# Patient Record
Sex: Female | Born: 1980 | Race: Asian | Hispanic: No | Marital: Married | Smoking: Never smoker
Health system: Southern US, Community
[De-identification: ages and names within clinical notes are randomized; demographics above are authoritative.]

## PROBLEM LIST (undated history)

## (undated) DIAGNOSIS — E039 Hypothyroidism, unspecified: Secondary | ICD-10-CM

## (undated) DIAGNOSIS — E119 Type 2 diabetes mellitus without complications: Secondary | ICD-10-CM

## (undated) DIAGNOSIS — O24419 Gestational diabetes mellitus in pregnancy, unspecified control: Secondary | ICD-10-CM

## (undated) HISTORY — DX: Type 2 diabetes mellitus without complications: E11.9

## (undated) HISTORY — PX: NO PAST SURGERIES: SHX2092

---

## 2016-08-24 ENCOUNTER — Other Ambulatory Visit: Payer: Self-pay | Admitting: Family Medicine

## 2016-08-24 DIAGNOSIS — N63 Unspecified lump in unspecified breast: Secondary | ICD-10-CM

## 2016-10-01 ENCOUNTER — Other Ambulatory Visit (HOSPITAL_COMMUNITY): Payer: Self-pay | Admitting: Obstetrics & Gynecology

## 2016-10-01 DIAGNOSIS — N979 Female infertility, unspecified: Secondary | ICD-10-CM

## 2016-10-04 ENCOUNTER — Encounter (HOSPITAL_COMMUNITY): Payer: Self-pay | Admitting: Radiology

## 2016-10-04 ENCOUNTER — Ambulatory Visit (HOSPITAL_COMMUNITY)
Admission: RE | Admit: 2016-10-04 | Discharge: 2016-10-04 | Disposition: A | Discharge status: Home / Self Care | Payer: BLUE CROSS/BLUE SHIELD | Source: Ambulatory Visit | Attending: Obstetrics & Gynecology | Admitting: Obstetrics & Gynecology

## 2016-10-04 DIAGNOSIS — N979 Female infertility, unspecified: Secondary | ICD-10-CM | POA: Diagnosis not present

## 2016-10-04 MED ORDER — IOPAMIDOL (ISOVUE-300) INJECTION 61%
30.0000 mL | Freq: Once | INTRAVENOUS | Status: AC | PRN
  Administered 2016-10-04: 16 mL

## 2016-10-05 ENCOUNTER — Other Ambulatory Visit: Payer: Self-pay | Admitting: Family Medicine

## 2016-10-05 DIAGNOSIS — N63 Unspecified lump in unspecified breast: Secondary | ICD-10-CM

## 2016-11-06 ENCOUNTER — Ambulatory Visit
Admission: RE | Admit: 2016-11-06 | Discharge: 2016-11-06 | Disposition: A | Discharge status: Home / Self Care | Payer: BLUE CROSS/BLUE SHIELD | Source: Ambulatory Visit | Attending: Family Medicine | Admitting: Family Medicine

## 2016-11-06 ENCOUNTER — Other Ambulatory Visit: Payer: Self-pay | Admitting: Family Medicine

## 2016-11-06 DIAGNOSIS — N6310 Unspecified lump in the right breast, unspecified quadrant: Secondary | ICD-10-CM

## 2016-11-06 DIAGNOSIS — N63 Unspecified lump in unspecified breast: Secondary | ICD-10-CM

## 2018-02-10 IMAGING — MG 2D DIGITAL DIAGNOSTIC BILATERAL MAMMOGRAM WITH CAD AND ADJUNCT T
9 of 15 series · 9 of 35 positions shown · non-contrast
Comparison: None

CLINICAL DATA: Palpable lump in the left breast.

EXAM:
2D DIGITAL DIAGNOSTIC BILATERAL MAMMOGRAM WITH CAD AND ADJUNCT TOMO
ULTRASOUND BILATERAL BREAST

[L CC synth-2D]
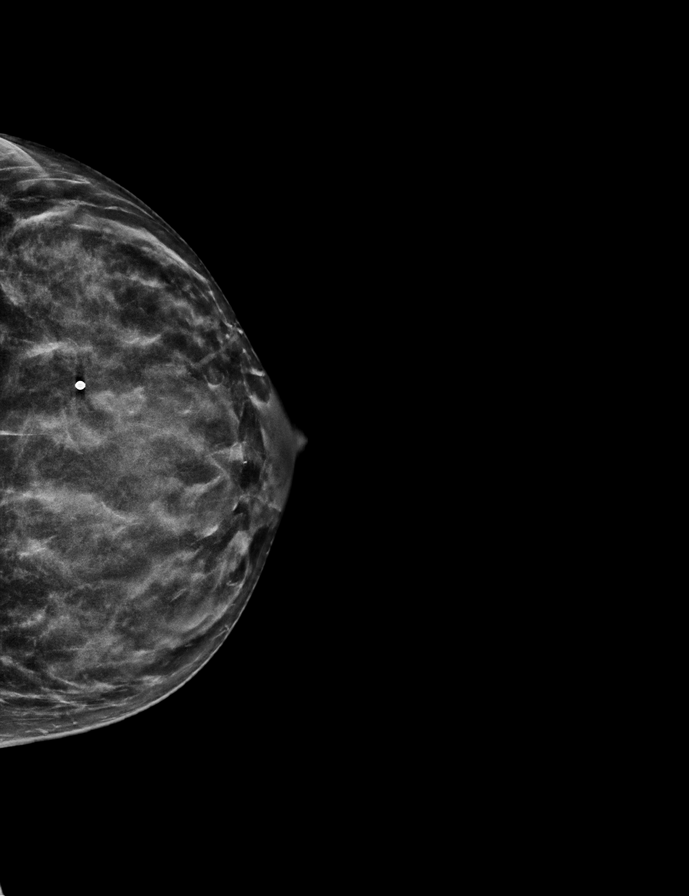

[R CC]
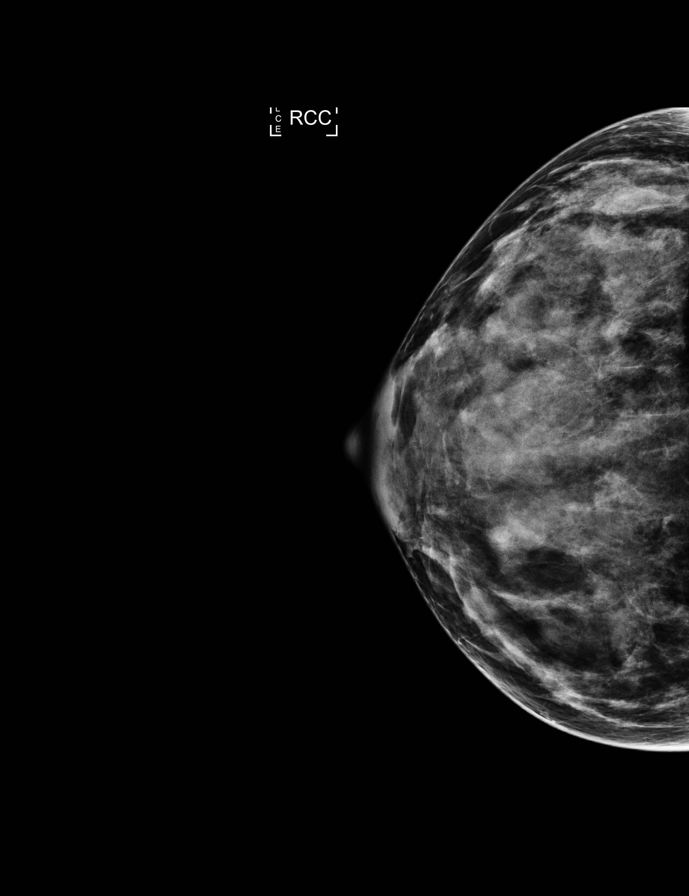

[L MLO]
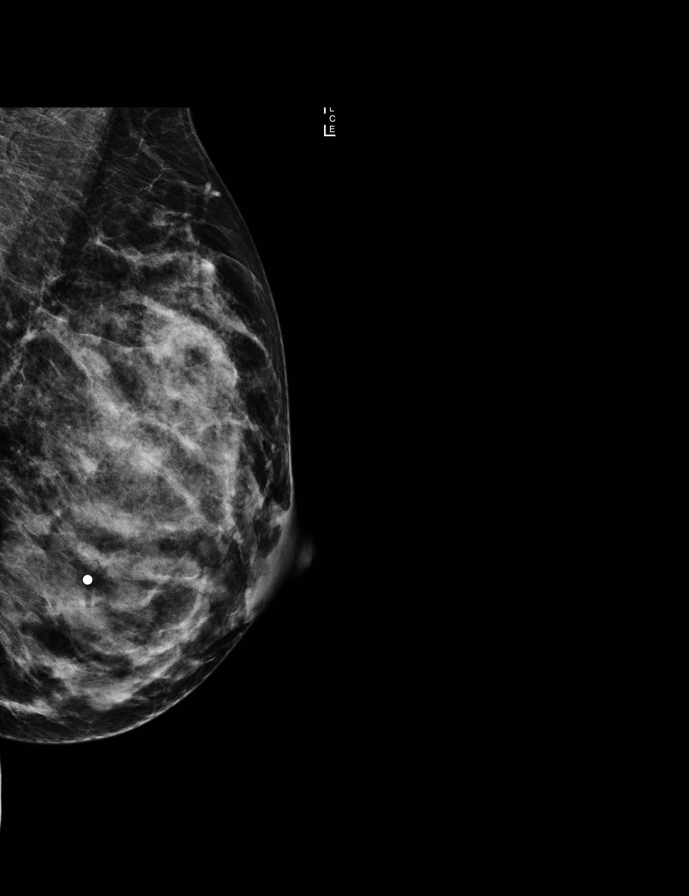

[L TAN]
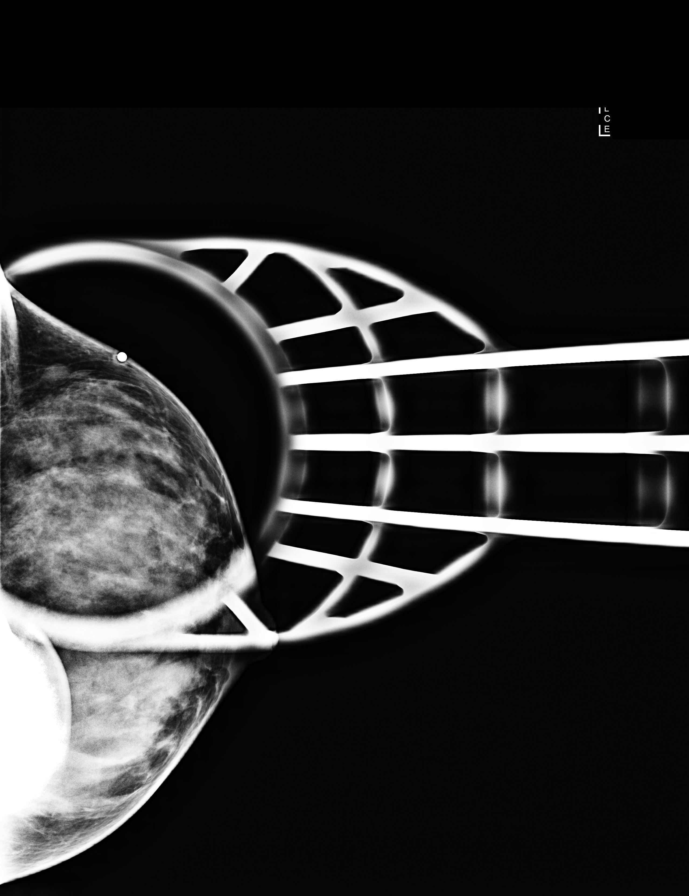

[L CC]
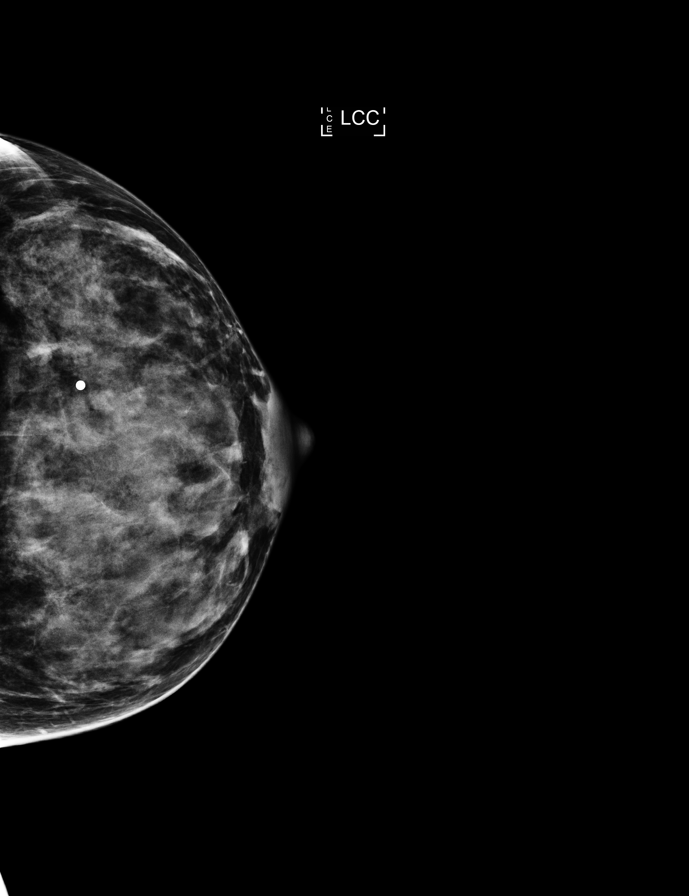

[R MLO]
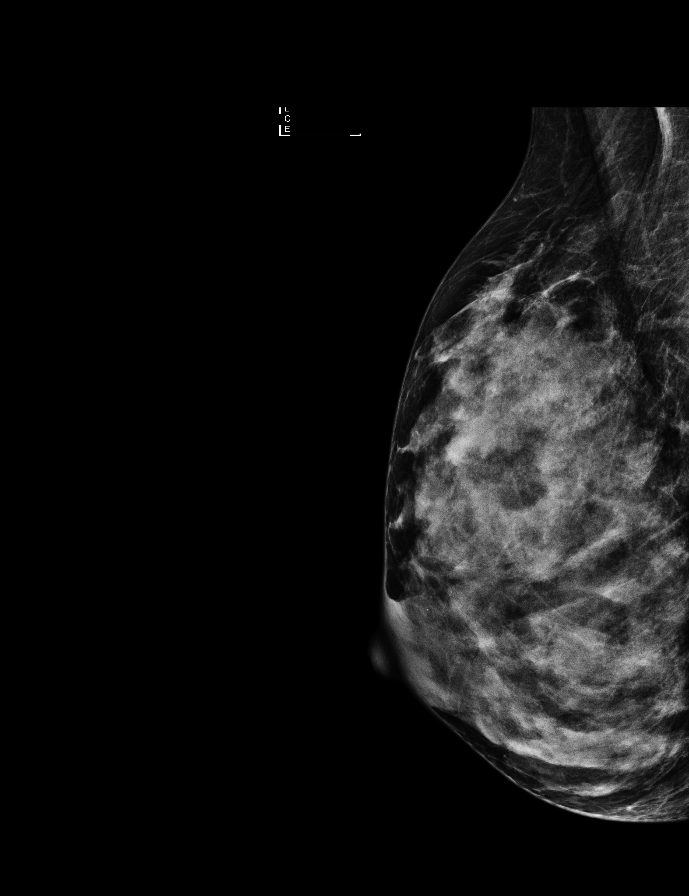

[L MLO synth-2D]
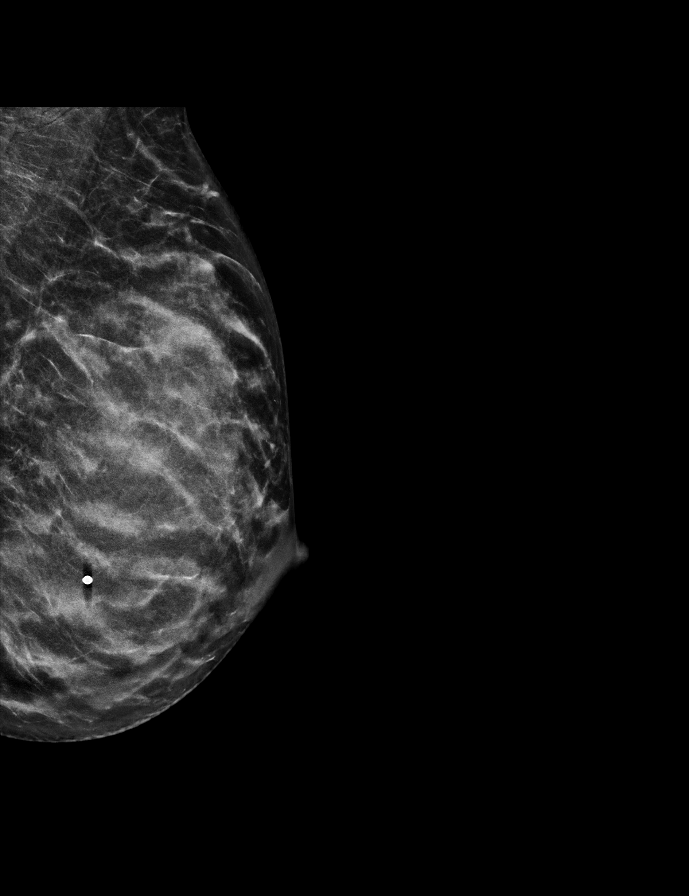

[R MLO synth-2D]
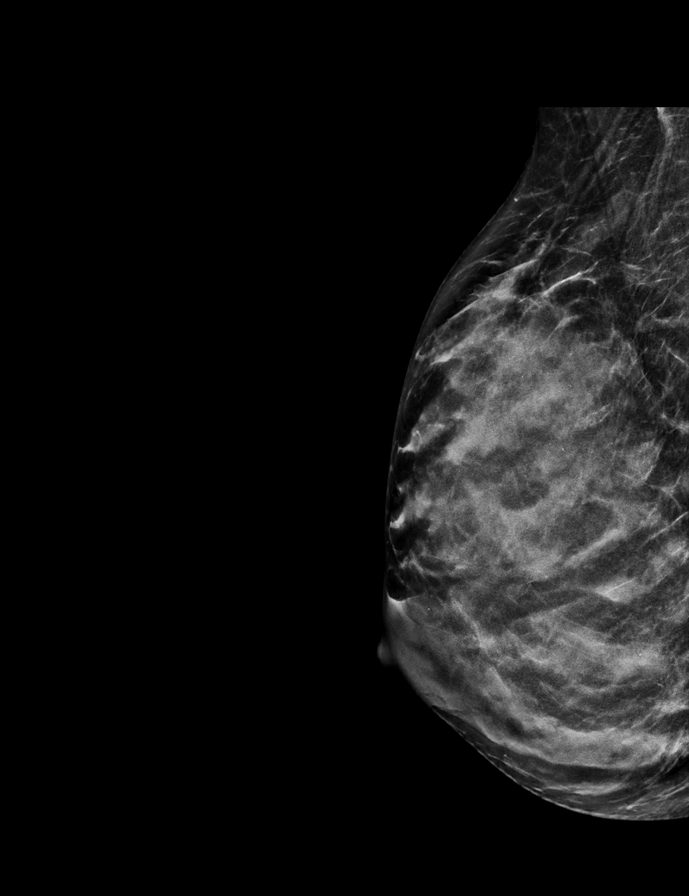

[R CC synth-2D]
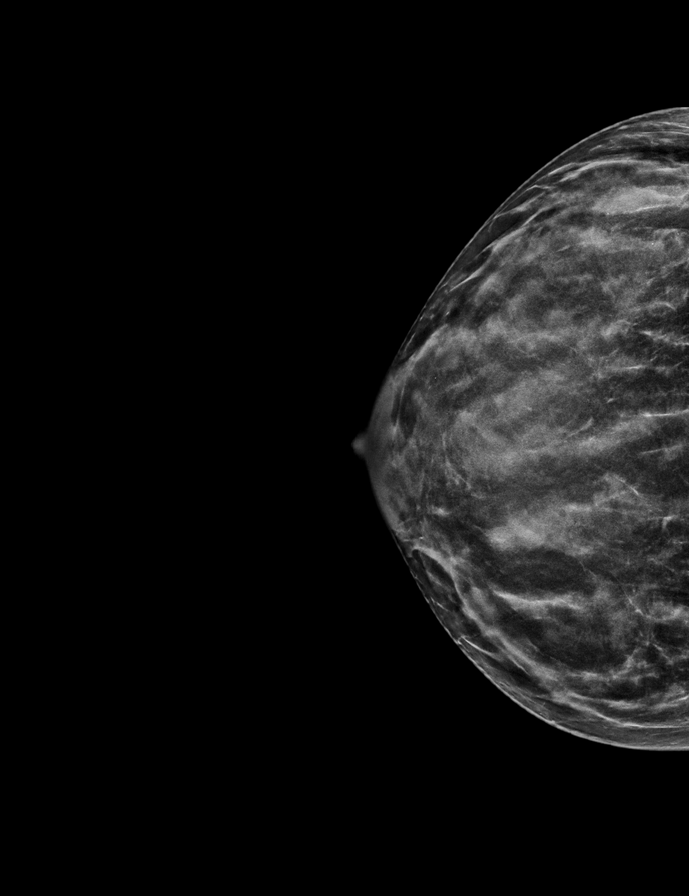

[9 of 35 positions shown; findings below may reference images not displayed]

ACR Breast Density Category c: The breast tissue is heterogeneously
dense, which may obscure small masses.
FINDINGS: There is an oval mass at 4 o'clock in the left breast. A rounded
asymmetry at 12 o'clock in the right breast is felt to most likely
represent glandular tissue.

Mammographic images were processed with CAD.

On physical exam, no suspicious lumps are identified.

Targeted ultrasound is performed, showing a simple cyst in the left
breast at 4 o'clock correlating with the mammographic findings. No
abnormalities seen on the right.
IMPRESSION: Fibrocystic changes on the left. No suspicious findings on the
right.

RECOMMENDATION:
Annual screening mammography beginning at the age of 40.

I have discussed the findings and recommendations with the patient.
Results were also provided in writing at the conclusion of the
visit. If applicable, a reminder letter will be sent to the patient
regarding the next appointment.

BI-RADS CATEGORY  2: Benign.

## 2018-02-21 LAB — OB RESULTS CONSOLE RUBELLA ANTIBODY, IGM: Rubella: IMMUNE

## 2018-02-21 LAB — OB RESULTS CONSOLE ABO/RH: RH Type: POSITIVE

## 2018-02-21 LAB — OB RESULTS CONSOLE ANTIBODY SCREEN: Antibody Screen: NEGATIVE

## 2018-02-21 LAB — OB RESULTS CONSOLE HEPATITIS B SURFACE ANTIGEN: Hepatitis B Surface Ag: NEGATIVE

## 2018-02-21 LAB — OB RESULTS CONSOLE RPR: RPR: NONREACTIVE

## 2018-02-21 LAB — OB RESULTS CONSOLE HIV ANTIBODY (ROUTINE TESTING): HIV: NONREACTIVE

## 2018-03-19 LAB — OB RESULTS CONSOLE GC/CHLAMYDIA
Chlamydia: NEGATIVE
Gonorrhea: NEGATIVE

## 2018-07-10 ENCOUNTER — Encounter: Payer: Self-pay | Admitting: Dietician

## 2018-07-10 ENCOUNTER — Ambulatory Visit: Payer: BLUE CROSS/BLUE SHIELD | Admitting: Dietician

## 2018-07-10 DIAGNOSIS — O24410 Gestational diabetes mellitus in pregnancy, diet controlled: Secondary | ICD-10-CM

## 2018-07-10 NOTE — Progress Notes (Signed)
Patient lives with her husband.  Her husband does the shopping and cooking.  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  This visit was completed via telephone due to the COVID-19 pandemic.   I spoke with Norma Newman and verified that I was speaking with the correct person with two patient identifiers (full name and date of birth).   I discussed the limitations related to this kind of visit and the patient is willing to proceed.  Her husband Norma Newman is present during the visit. Time of visit:  4:15-5:15.  Consult was for BG management, insulin instruction, and Carbohydrate Counting in patient with newly diagnosed GDM. Currently [redacted] weeks gestation 113 lbs and 92 lbs pre pregnancy.  Diet hx: Dislikes vegetables (will eat broccoli, green beans, mushrooms, bok choy, and salad) Breakfast:  Egg, sausage, 1 cup white rice Snack:  None Lunch:  White rice, chicken Snack chips or cookies Dinner:  White rice, chicken or pork Snack:  Milk Beverages:  1 small can regular soda, water, decaf coffee with 1 tsp sugar  Patient was seen on 07/10/18 for Gestational Diabetes self-management. The following learning objectives were met by the patient during this course:   States the definition of Gestational Diabetes  States why dietary management is important in controlling blood glucose  Describes the effects each nutrient has on blood glucose levels  Demonstrates ability to create a balanced meal plan  Demonstrates carbohydrate counting   States when to check blood glucose levels  Demonstrates proper blood glucose monitoring techniques  States the effect of stress and exercise on blood glucose levels  States the importance of limiting caffeine and abstaining from alcohol and smoking  She is to pick up her BG meter tomorrow and we will discuss its use after. Insulin instruction basics provided.  Her husband gave the IVF shots and discussed that she would need to learn to give them herself if  needed.  Patient instructed to monitor glucose levels: FBS: 60 - <90 1 hour: <140 2 hour: <120  Plan: Check your blood sugar 4 times daily  Before breakfast  2 hours after the start of Breakfast, Lunch, and dinner  Consider ways to be more active such as walking or a pregnancy exercise video on the computer or other as allowed by your MD  Consider having smaller amounts of rice with your meals, changing to brown rice and including small snacks throughout the day. Consider adding vegetables throughout the day.  Please call or email be with any questions.    *Patient received handouts:  Nutrition Diabetes and Pregnancy  Snack list  Patient will be seen for follow-up as needed.

## 2018-07-10 NOTE — Patient Instructions (Signed)
Check your blood sugar 4 times daily  Before breakfast  2 hours after the start of Breakfast, Lunch, and dinner  Consider ways to be more active such as walking or a pregnancy exercise video on the computer or other as allowed by your MD  Consider having smaller amounts of rice with your meals, changing to brown rice and including small snacks throughout the day. Consider adding vegetables throughout the day.  Please call or email be with any questions.    Antonieta Iba, RD, LDN, CDE [email or web address]

## 2018-07-14 ENCOUNTER — Telehealth: Payer: Self-pay | Admitting: Dietician

## 2018-07-14 NOTE — Telephone Encounter (Signed)
Called and left a message on Scharlene's phone 07/13/18 and 07/14/18 for a follow up regarding blood glucose monitoring.  Left my cell phone number on her answering machine today and asked her to text or leave a message regarding a good time to follow up regarding instruction on BG monitoring or to see if she has been doing this and how it is going.  Antonieta Iba, RD, LDN, CDE

## 2018-07-14 NOTE — Telephone Encounter (Signed)
Norma Newman responded via text.  They have the FreeStyle Lite BG meter and have been using this since Saturday. BG readings are as follows:  07/12/18:  111 2 hours after lunch, 79 2 hours after dinner  07/13/18:  74 before breakfast, 84 after breakfast, 115 after lunch, and 109 after dinner.  I responded that her BG numbers look good.  She should be sure to eat adequate amounts as to not be hungry and nourish herself and the baby.   Told her to contact me with any questions.  Antonieta Iba, RD, LDN, CDE

## 2018-09-01 ENCOUNTER — Encounter (HOSPITAL_COMMUNITY): Payer: Self-pay | Admitting: *Deleted

## 2018-09-01 ENCOUNTER — Inpatient Hospital Stay (HOSPITAL_COMMUNITY)
Admission: AD | Admit: 2018-09-01 | Discharge: 2018-09-05 | DRG: 788 | Disposition: A | Discharge status: Home / Self Care | Payer: BC Managed Care – PPO | Attending: Obstetrics & Gynecology | Admitting: Obstetrics & Gynecology

## 2018-09-01 ENCOUNTER — Other Ambulatory Visit: Payer: Self-pay

## 2018-09-01 DIAGNOSIS — E039 Hypothyroidism, unspecified: Secondary | ICD-10-CM | POA: Diagnosis present

## 2018-09-01 DIAGNOSIS — O139 Gestational [pregnancy-induced] hypertension without significant proteinuria, unspecified trimester: Secondary | ICD-10-CM | POA: Diagnosis present

## 2018-09-01 DIAGNOSIS — O1404 Mild to moderate pre-eclampsia, complicating childbirth: Secondary | ICD-10-CM | POA: Diagnosis present

## 2018-09-01 DIAGNOSIS — O3413 Maternal care for benign tumor of corpus uteri, third trimester: Secondary | ICD-10-CM | POA: Diagnosis present

## 2018-09-01 DIAGNOSIS — O133 Gestational [pregnancy-induced] hypertension without significant proteinuria, third trimester: Secondary | ICD-10-CM

## 2018-09-01 DIAGNOSIS — O99284 Endocrine, nutritional and metabolic diseases complicating childbirth: Secondary | ICD-10-CM | POA: Diagnosis present

## 2018-09-01 DIAGNOSIS — D259 Leiomyoma of uterus, unspecified: Secondary | ICD-10-CM | POA: Diagnosis present

## 2018-09-01 DIAGNOSIS — O24420 Gestational diabetes mellitus in childbirth, diet controlled: Secondary | ICD-10-CM | POA: Diagnosis present

## 2018-09-01 DIAGNOSIS — Z3A37 37 weeks gestation of pregnancy: Secondary | ICD-10-CM

## 2018-09-01 DIAGNOSIS — Z1159 Encounter for screening for other viral diseases: Secondary | ICD-10-CM | POA: Diagnosis not present

## 2018-09-01 HISTORY — DX: Hypothyroidism, unspecified: E03.9

## 2018-09-01 HISTORY — DX: Gestational diabetes mellitus in pregnancy, unspecified control: O24.419

## 2018-09-01 LAB — COMPREHENSIVE METABOLIC PANEL
ALT: 20 U/L (ref 0–44)
AST: 27 U/L (ref 15–41)
Albumin: 2.5 g/dL — ABNORMAL LOW (ref 3.5–5.0)
Alkaline Phosphatase: 113 U/L (ref 38–126)
Anion gap: 8 (ref 5–15)
BUN: 13 mg/dL (ref 6–20)
CO2: 19 mmol/L — ABNORMAL LOW (ref 22–32)
Calcium: 8.8 mg/dL — ABNORMAL LOW (ref 8.9–10.3)
Chloride: 109 mmol/L (ref 98–111)
Creatinine, Ser: 0.83 mg/dL (ref 0.44–1.00)
GFR calc Af Amer: >60 mL/min (ref >60)
GFR calc non Af Amer: >60 mL/min (ref >60)
Glucose, Bld: 84 mg/dL (ref 70–99)
Potassium: 4.5 mmol/L (ref 3.5–5.1)
Sodium: 136 mmol/L (ref 135–145)
Total Bilirubin: 0.7 mg/dL (ref 0.3–1.2)
Total Protein: 5.2 g/dL — ABNORMAL LOW (ref 6.5–8.1)

## 2018-09-01 LAB — TYPE AND SCREEN
ABO/RH(D): B POS
Antibody Screen: NEGATIVE

## 2018-09-01 LAB — PROTEIN / CREATININE RATIO, URINE
Creatinine, Urine: 65.18 mg/dL
Protein Creatinine Ratio: 6.83 mg/mg{Cre} — ABNORMAL HIGH (ref 0.00–0.15)
Total Protein, Urine: 445 mg/dL

## 2018-09-01 LAB — OB RESULTS CONSOLE GBS: GBS: NEGATIVE

## 2018-09-01 LAB — CBC
HCT: 37.7 % (ref 36.0–46.0)
Hemoglobin: 13.3 g/dL (ref 12.0–15.0)
MCH: 32.3 pg (ref 26.0–34.0)
MCHC: 35.3 g/dL (ref 30.0–36.0)
MCV: 91.5 fL (ref 80.0–100.0)
Platelets: 215 10*3/uL (ref 150–400)
RBC: 4.12 MIL/uL (ref 3.87–5.11)
RDW: 12.9 % (ref 11.5–15.5)
WBC: 8.7 10*3/uL (ref 4.0–10.5)
nRBC: 0 % (ref 0.0–0.2)

## 2018-09-01 LAB — SARS CORONAVIRUS 2 BY RT PCR (HOSPITAL ORDER, PERFORMED IN ~~LOC~~ HOSPITAL LAB): SARS Coronavirus 2: NEGATIVE

## 2018-09-01 LAB — ABO/RH: ABO/RH(D): B POS

## 2018-09-01 LAB — GLUCOSE, CAPILLARY: Glucose-Capillary: 80 mg/dL (ref 70–99)

## 2018-09-01 MED ORDER — HYDRALAZINE HCL 20 MG/ML IJ SOLN: 10.0000 mg | INTRAMUSCULAR | Status: DC | PRN

## 2018-09-01 MED ORDER — LABETALOL HCL 5 MG/ML IV SOLN: 20.0000 mg | INTRAVENOUS | Status: DC | PRN

## 2018-09-01 MED ORDER — LABETALOL HCL 5 MG/ML IV SOLN: 80.0000 mg | INTRAVENOUS | Status: DC | PRN

## 2018-09-01 MED ORDER — LIDOCAINE HCL (PF) 1 % IJ SOLN: 30.0000 mL | INTRAMUSCULAR | Status: DC | PRN

## 2018-09-01 MED ORDER — SOD CITRATE-CITRIC ACID 500-334 MG/5ML PO SOLN
30.0000 mL | ORAL | Status: DC | PRN
  Filled 2018-09-01: qty 30

## 2018-09-01 MED ORDER — LACTATED RINGERS IV SOLN
INTRAVENOUS | Status: DC
  Administered 2018-09-01 – 2018-09-02 (×6): via INTRAVENOUS

## 2018-09-01 MED ORDER — LEVOTHYROXINE SODIUM 25 MCG PO TABS
75.0000 ug | ORAL_TABLET | Freq: Every day | ORAL | Status: DC
  Administered 2018-09-02 – 2018-09-05 (×4): 75 ug via ORAL
  Filled 2018-09-01: qty 1
  Filled 2018-09-01 (×2): qty 3
  Filled 2018-09-01: qty 1
  Filled 2018-09-01: qty 3

## 2018-09-01 MED ORDER — LABETALOL HCL 5 MG/ML IV SOLN: 40.0000 mg | INTRAVENOUS | Status: DC | PRN

## 2018-09-01 MED ORDER — LACTATED RINGERS IV SOLN
500.0000 mL | INTRAVENOUS | Status: DC | PRN
  Administered 2018-09-02: 1000 mL via INTRAVENOUS

## 2018-09-01 MED ORDER — MISOPROSTOL 25 MCG QUARTER TABLET
25.0000 ug | ORAL_TABLET | ORAL | Status: DC | PRN
  Administered 2018-09-01 (×2): 25 ug via VAGINAL
  Filled 2018-09-01 (×2): qty 1

## 2018-09-01 MED ORDER — TERBUTALINE SULFATE 1 MG/ML IJ SOLN: 0.2500 mg | Freq: Once | INTRAMUSCULAR | Status: DC | PRN

## 2018-09-01 MED ORDER — OXYTOCIN BOLUS FROM INFUSION: 500.0000 mL | Freq: Once | INTRAVENOUS | Status: DC

## 2018-09-01 MED ORDER — OXYTOCIN 40 UNITS IN NORMAL SALINE INFUSION - SIMPLE MED: 2.5000 [IU]/h | INTRAVENOUS | Status: DC

## 2018-09-01 MED ORDER — ACETAMINOPHEN 325 MG PO TABS: 650.0000 mg | ORAL_TABLET | ORAL | Status: DC | PRN

## 2018-09-01 MED ORDER — OXYCODONE-ACETAMINOPHEN 5-325 MG PO TABS: 1.0000 | ORAL_TABLET | ORAL | Status: DC | PRN

## 2018-09-01 MED ORDER — BUTORPHANOL TARTRATE 1 MG/ML IJ SOLN
1.0000 mg | INTRAMUSCULAR | Status: DC | PRN
  Administered 2018-09-02 (×2): 1 mg via INTRAVENOUS
  Filled 2018-09-01 (×2): qty 1

## 2018-09-01 MED ORDER — OXYCODONE-ACETAMINOPHEN 5-325 MG PO TABS: 2.0000 | ORAL_TABLET | ORAL | Status: DC | PRN

## 2018-09-01 NOTE — Plan of Care (Signed)
Patient educated about the S/S of Pre eclampsia during labor as well as postpartum.  Patient verbalized understanding and encouraged to ask questions throughout admission.

## 2018-09-01 NOTE — H&P (Signed)
HPI: 38 y/o G1P0 @ [redacted]w[redacted]d estimated gestational age (as dated by LMP c/w first trimester ultrasound) presents for IOL due to preeclampsia- no severe features.   no Leaking of Fluid,   no Vaginal Bleeding,   no Uterine Contractions,  + Fetal Movement.  Prenatal care has been provided by Dr. Nelda Marseille  ROS: no HA, no epigastric pain, no visual changes.    Pregnancy complicated by: 1) Preeclampsia BP elevated in office, lab work completed today PC ratio 6.8  2) GDMA1- well controlled with diet Last US@ 34w- vertex, 5#2oz (49%), stable fibroid- post 5.3cm  3) Hypothyroidism- duration of pregnancy 39mcg daily, recently increased to 48mcg daily 4)  AMA- normal genetic screening and [place] 5) IVF pregnancy   Prenatal Transfer Tool  Maternal Diabetes: Yes:  Diabetes Type:  Diet controlled Genetic Screening: Normal Maternal Ultrasounds/Referrals: Normal Fetal Ultrasounds or other Referrals:  None Maternal Substance Abuse:  No Significant Maternal Medications:  Meds include: Syntroid Significant Maternal Lab Results: Group B Strep negative   PNL:  GBS negative, Rub Immune, Hep B neg, RPR NR, HIV neg, GC/C neg, glucola:abnormal Hgb: 12.8 Blood type: B positive, antibody ne  Immunizations: Tdap: 5/22 Flu: 1/8  OBHx: primip PMHx:  hypothyroidism Meds:  PNV, synthroid,  Allergy:   Allergies  Allergen Reactions  . Bactrim [Sulfamethoxazole-Trimethoprim] Hives    Stomach pain, hives, itching   SurgHx: none SocHx:   no Tobacco, no  EtOH, no Illicit Drugs  O: BP (!) 137/94   Pulse 78   Temp 98.6 F (37 C) (Oral)   Resp 20   Ht 4\' 10"  (1.473 m)   Wt 58.1 kg   SpO2 99%   BMI 26.75 kg/m  Gen. AAOx3, NAD CV.  RRR  No murmur.  Resp. CTAB, no wheeze or crackles. Abd. Gravid,  no tenderness,  no rigidity,  no guarding Extr.  2+ edema B/L , no calf tenderness, neg Homan's B/L, DTRs 2+, no clonus  FHT: 140 baseline, moderate variability, + accels,  no decels Toco: irregular  SVE:  deferred   Labs:  Results for orders placed or performed during the hospital encounter of 09/01/18 (from the past 24 hour(s))  CBC     Status: None   Collection Time: 09/01/18  1:42 PM  Result Value Ref Range   WBC 8.7 4.0 - 10.5 K/uL   RBC 4.12 3.87 - 5.11 MIL/uL   Hemoglobin 13.3 12.0 - 15.0 g/dL   HCT 37.7 36.0 - 46.0 %   MCV 91.5 80.0 - 100.0 fL   MCH 32.3 26.0 - 34.0 pg   MCHC 35.3 30.0 - 36.0 g/dL   RDW 12.9 11.5 - 15.5 %   Platelets 215 150 - 400 K/uL   nRBC 0.0 0.0 - 0.2 %  Comprehensive metabolic panel     Status: Abnormal   Collection Time: 09/01/18  1:42 PM  Result Value Ref Range   Sodium 136 135 - 145 mmol/L   Potassium 4.5 3.5 - 5.1 mmol/L   Chloride 109 98 - 111 mmol/L   CO2 19 (L) 22 - 32 mmol/L   Glucose, Bld 84 70 - 99 mg/dL   BUN 13 6 - 20 mg/dL   Creatinine, Ser 0.83 0.44 - 1.00 mg/dL   Calcium 8.8 (L) 8.9 - 10.3 mg/dL   Total Protein 5.2 (L) 6.5 - 8.1 g/dL   Albumin 2.5 (L) 3.5 - 5.0 g/dL   AST 27 15 - 41 U/L   ALT 20 0 - 44  U/L   Alkaline Phosphatase 113 38 - 126 U/L   Total Bilirubin 0.7 0.3 - 1.2 mg/dL   GFR calc non Af Amer >60 >60 mL/min   GFR calc Af Amer >60 >60 mL/min   Anion gap 8 5 - 15  Protein / creatinine ratio, urine     Status: Abnormal   Collection Time: 09/01/18  2:59 PM  Result Value Ref Range   Creatinine, Urine 65.18 mg/dL   Total Protein, Urine 445 mg/dL   Protein Creatinine Ratio 6.83 (H) 0.00 - 0.15 mg/mg[Cre]  SARS Coronavirus 2 (CEPHEID - Performed in [place] hospital lab), Hosp Order     Status: None   Collection Time: 09/01/18  3:13 PM   Specimen: Nasopharyngeal Swab  Result Value Ref Range   SARS Coronavirus 2 NEGATIVE NEGATIVE  Glucose, capillary     Status: None   Collection Time: 09/01/18  4:07 PM  Result Value Ref Range   Glucose-Capillary 80 70 - 99 mg/dL  Type and screen [place]     Status: None   Collection Time: 09/01/18  4:10 PM  Result Value Ref Range   ABO/RH(D) B POS     Antibody Screen NEG    Sample Expiration      09/04/2018,2359 Performed at [facility], [place] [address] [place], [place] [address]      A/P:  38 y.o. G1Po @ [redacted]w[redacted]d EGA who presents for IOL due to preeclampsia -FWB:  NICHD Cat I FHTs -Labor: plan for induction with cytotec -GBS: negative -Preeclampsia- Labetalol protocol written, will plan for Magensium postopartum or if she develops severe features, currently asymptomatic, labs as above -Pain management: IV or epidural upon request -Hypothyroidism- continue Levothyroxine 37mcg daily  Janyth Pupa, DO [phone] (cell) [phone] (office)

## 2018-09-01 NOTE — MAU Provider Note (Signed)
History     CSN: [id number]  Arrival date and time: 09/01/18 1256   First Provider Initiated Contact with Patient 09/01/18 1445      Chief Complaint  Patient presents with  . Hypertension    MD sent from office last check 150/100, +protein in urine last thursday   HPI  [name] is a 38 y.o. year old G63P0 female at [redacted]w[redacted]d weeks gestation who was sent to MAU from her OB office for [facility] evaluation. She was there today for BP check, because she "had protein in urine on Thursday (08/28/18)." Her BP in their office was 150/100. She denies any H/A, dizziness, blurry vision, epigastric pain, or increased swelling. She reports good (+) FM today. She receives [facility] with [facility] (Norma Newman).  Past Medical History:  Diagnosis Date  . Gestational diabetes     History reviewed. No pertinent surgical history.  History reviewed. No pertinent family history.  Social History   Tobacco Use  . Smoking status: Never Smoker  . Smokeless tobacco: Never Used  Substance Use Topics  . Alcohol use: Never    Frequency: Never  . Drug use: Never    Allergies:  Allergies  Allergen Reactions  . Bactrim [Sulfamethoxazole-Trimethoprim] Hives    Stomach pain, hives, itching    Medications Prior to Admission  Medication Sig Dispense Refill Last Dose  . levothyroxine (SYNTHROID) 100 MCG tablet Take 100 mcg by mouth daily before breakfast. 0.5 mg daily   09/01/2018 at 0830  . Prenatal Vit-Fe Fumarate-FA (MULTIVITAMIN-PRENATAL) 27-0.8 MG TABS tablet Take 1 tablet by mouth daily at 12 noon.   08/31/2018 at 2200    Review of Systems  Constitutional: Negative.   HENT: Negative.   Eyes: Negative.   Respiratory: Negative.   Cardiovascular: Negative.   Gastrointestinal: Negative.   Endocrine: Negative.   Genitourinary: Negative.   Musculoskeletal: Negative.   Skin: Negative.   Allergic/Immunologic: Negative.   Neurological: Negative.   Hematological: Negative.   Psychiatric/Behavioral:  Negative.    Physical Exam   Patient Vitals for the past 24 hrs:  BP Temp Temp src Pulse Resp SpO2 Height Weight  09/01/18 1440 (!) 142/95 - - 71 - - - -  09/01/18 1429 - - - - 16 99 % 4\' 10"  (1.473 m) 58.1 kg  09/01/18 1420 (!) 146/93 - - 77 - - - -  09/01/18 1419 (!) 148/98 - - 75 - 99 % - -  09/01/18 1349 (!) 151/101 98.8 F (37.1 C) Oral (!) 1 17 - - -    Physical Exam  Nursing note and vitals reviewed. Constitutional: She is oriented to person, place, and time. She appears well-developed and well-nourished.  HENT:  Head: Normocephalic and atraumatic.  Eyes: Pupils are equal, round, and reactive to light.  Neck: Normal range of motion.  Cardiovascular: Normal rate and normal heart sounds.  Respiratory: Effort normal.  GI: Soft.  Genitourinary:    Genitourinary Comments: Dilation: 1 Effacement (%): 60 Cervical Position: Posterior Station: -3 Presentation: Vertex Exam by: Norma Newman, CNM    Neurological: She is alert and oriented to person, place, and time.  Skin: Skin is warm and dry.  Psychiatric: She has a normal mood and affect. Her behavior is normal. Judgment and thought content normal.    MAU Course  Procedures  MDM CCUA CBC CMP P/C Ratio Serial BP's  NST - FHR: 155 bpm / moderate variability / accels present / decels absent / TOCO: irregular every 2-4 mins  SARS COVID-19 testing (stat prior to admission)  *Consult with Norma Newman @ 1500 - called to state that she plans to admit this patient for either gHTN or PEC and is entering orders in system.  Results for orders placed or performed during the hospital encounter of 09/01/18 (from the past 24 hour(s))  CBC     Status: None   Collection Time: 09/01/18  1:42 PM  Result Value Ref Range   WBC 8.7 4.0 - 10.5 K/uL   RBC 4.12 3.87 - 5.11 MIL/uL   Hemoglobin 13.3 12.0 - 15.0 g/dL   HCT 37.7 36.0 - 46.0 %   MCV 91.5 80.0 - 100.0 fL   MCH 32.3 26.0 - 34.0 pg   MCHC 35.3 30.0 - 36.0 g/dL   RDW 12.9 11.5 -  15.5 %   Platelets 215 150 - 400 K/uL   nRBC 0.0 0.0 - 0.2 %  Comprehensive metabolic panel     Status: Abnormal   Collection Time: 09/01/18  1:42 PM  Result Value Ref Range   Sodium 136 135 - 145 mmol/L   Potassium 4.5 3.5 - 5.1 mmol/L   Chloride 109 98 - 111 mmol/L   CO2 19 (L) 22 - 32 mmol/L   Glucose, Bld 84 70 - 99 mg/dL   BUN 13 6 - 20 mg/dL   Creatinine, Ser 0.83 0.44 - 1.00 mg/dL   Calcium 8.8 (L) 8.9 - 10.3 mg/dL   Total Protein 5.2 (L) 6.5 - 8.1 g/dL   Albumin 2.5 (L) 3.5 - 5.0 g/dL   AST 27 15 - 41 U/L   ALT 20 0 - 44 U/L   Alkaline Phosphatase 113 38 - 126 U/L   Total Bilirubin 0.7 0.3 - 1.2 mg/dL   GFR calc non Af Amer >60 >60 mL/min   GFR calc Af Amer >60 >60 mL/min   Anion gap 8 5 - 15      Assessment and Plan  Gestational hypertension, third trimester - Awaiting P/C Ratio for dx of PEC vs gHTN - Norma Newman assumes care of patient upon admission to L&D - See Norma Newman documentation for H&P  Norma Deep, MSN, CNM 09/01/2018, 2:52 PM

## 2018-09-01 NOTE — MAU Note (Signed)
Pt came from dr office for elevated bp

## 2018-09-02 ENCOUNTER — Inpatient Hospital Stay (HOSPITAL_COMMUNITY): Payer: BC Managed Care – PPO | Admitting: Anesthesiology

## 2018-09-02 ENCOUNTER — Encounter (HOSPITAL_COMMUNITY): Payer: Self-pay | Admitting: *Deleted

## 2018-09-02 ENCOUNTER — Encounter (HOSPITAL_COMMUNITY)
Admission: AD | Disposition: A | Discharge status: Home / Self Care | Payer: Self-pay | Source: Home / Self Care | Attending: Obstetrics & Gynecology

## 2018-09-02 LAB — CBC
HCT: 35.7 % — ABNORMAL LOW (ref 36.0–46.0)
Hemoglobin: 12.5 g/dL (ref 12.0–15.0)
MCH: 32 pg (ref 26.0–34.0)
MCHC: 35 g/dL (ref 30.0–36.0)
MCV: 91.3 fL (ref 80.0–100.0)
Platelets: 189 10*3/uL (ref 150–400)
RBC: 3.91 MIL/uL (ref 3.87–5.11)
RDW: 13.1 % (ref 11.5–15.5)
WBC: 12.8 10*3/uL — ABNORMAL HIGH (ref 4.0–10.5)
nRBC: 0 % (ref 0.0–0.2)

## 2018-09-02 LAB — COMPREHENSIVE METABOLIC PANEL
ALT: 19 U/L (ref 0–44)
ALT: 20 U/L (ref 0–44)
AST: 26 U/L (ref 15–41)
AST: 28 U/L (ref 15–41)
Albumin: 2.3 g/dL — ABNORMAL LOW (ref 3.5–5.0)
Albumin: 2.3 g/dL — ABNORMAL LOW (ref 3.5–5.0)
Alkaline Phosphatase: 105 U/L (ref 38–126)
Alkaline Phosphatase: 104 U/L (ref 38–126)
Anion gap: 8 (ref 5–15)
Anion gap: 9 (ref 5–15)
BUN: 12 mg/dL (ref 6–20)
BUN: 17 mg/dL (ref 6–20)
CO2: 19 mmol/L — ABNORMAL LOW (ref 22–32)
CO2: 18 mmol/L — ABNORMAL LOW (ref 22–32)
Calcium: 8.3 mg/dL — ABNORMAL LOW (ref 8.9–10.3)
Calcium: 7.9 mg/dL — ABNORMAL LOW (ref 8.9–10.3)
Chloride: 110 mmol/L (ref 98–111)
Chloride: 110 mmol/L (ref 98–111)
Creatinine, Ser: 0.76 mg/dL (ref 0.44–1.00)
Creatinine, Ser: 1.08 mg/dL — ABNORMAL HIGH (ref 0.44–1.00)
GFR calc Af Amer: >60 mL/min (ref >60)
GFR calc Af Amer: >60 mL/min (ref >60)
GFR calc non Af Amer: >60 mL/min (ref >60)
GFR calc non Af Amer: >60 mL/min (ref >60)
Glucose, Bld: 87 mg/dL (ref 70–99)
Glucose, Bld: 101 mg/dL — ABNORMAL HIGH (ref 70–99)
Potassium: 4.3 mmol/L (ref 3.5–5.1)
Potassium: 5 mmol/L (ref 3.5–5.1)
Sodium: 137 mmol/L (ref 135–145)
Sodium: 137 mmol/L (ref 135–145)
Total Bilirubin: 0.7 mg/dL (ref 0.3–1.2)
Total Bilirubin: 0.6 mg/dL (ref 0.3–1.2)
Total Protein: 4.9 g/dL — ABNORMAL LOW (ref 6.5–8.1)
Total Protein: 5.1 g/dL — ABNORMAL LOW (ref 6.5–8.1)

## 2018-09-02 LAB — GLUCOSE, CAPILLARY: Glucose-Capillary: 97 mg/dL (ref 70–99)

## 2018-09-02 LAB — RPR: RPR Ser Ql: NONREACTIVE

## 2018-09-02 SURGERY — Surgical Case
Anesthesia: Epidural

## 2018-09-02 MED ORDER — EPHEDRINE 5 MG/ML INJ: 10.0000 mg | INTRAVENOUS | Status: DC | PRN

## 2018-09-02 MED ORDER — OXYTOCIN 40 UNITS IN NORMAL SALINE INFUSION - SIMPLE MED
INTRAVENOUS | Status: AC
  Filled 2018-09-02: qty 1000

## 2018-09-02 MED ORDER — PHENYLEPHRINE HCL (PRESSORS) 10 MG/ML IV SOLN
INTRAVENOUS | Status: DC | PRN
  Administered 2018-09-02 (×8): 80 ug via INTRAVENOUS

## 2018-09-02 MED ORDER — SODIUM CHLORIDE (PF) 0.9 % IJ SOLN
INTRAMUSCULAR | Status: DC | PRN
  Administered 2018-09-02: 12 mL/h via EPIDURAL

## 2018-09-02 MED ORDER — DEXAMETHASONE SODIUM PHOSPHATE 4 MG/ML IJ SOLN
INTRAMUSCULAR | Status: DC | PRN
  Administered 2018-09-02: 4 mg via INTRAVENOUS

## 2018-09-02 MED ORDER — DIPHENHYDRAMINE HCL 25 MG PO CAPS: 25.0000 mg | ORAL_CAPSULE | ORAL | Status: DC | PRN

## 2018-09-02 MED ORDER — MEPERIDINE HCL 25 MG/ML IJ SOLN: 6.2500 mg | INTRAMUSCULAR | Status: DC | PRN

## 2018-09-02 MED ORDER — SCOPOLAMINE 1 MG/3DAYS TD PT72: 1.0000 | MEDICATED_PATCH | Freq: Once | TRANSDERMAL | Status: DC

## 2018-09-02 MED ORDER — SODIUM CHLORIDE 0.9 % IR SOLN
Status: DC | PRN
  Administered 2018-09-02: 30 mL
  Administered 2018-09-02: 1

## 2018-09-02 MED ORDER — MORPHINE SULFATE (PF) 0.5 MG/ML IJ SOLN
INTRAMUSCULAR | Status: AC
  Filled 2018-09-02: qty 10

## 2018-09-02 MED ORDER — ONDANSETRON HCL 4 MG/2ML IJ SOLN
INTRAMUSCULAR | Status: DC | PRN
  Administered 2018-09-02: 4 mg via INTRAVENOUS

## 2018-09-02 MED ORDER — LACTATED RINGERS IV BOLUS
500.0000 mL | Freq: Once | INTRAVENOUS | Status: AC
  Administered 2018-09-02: 16:00:00 500 mL via INTRAVENOUS

## 2018-09-02 MED ORDER — SODIUM CHLORIDE 0.9 % IV SOLN
INTRAVENOUS | Status: DC | PRN
  Administered 2018-09-02: 23:00:00 via INTRAVENOUS

## 2018-09-02 MED ORDER — LACTATED RINGERS IV SOLN
500.0000 mL | Freq: Once | INTRAVENOUS | Status: AC
  Administered 2018-09-02: 08:00:00 1000 mL via INTRAVENOUS

## 2018-09-02 MED ORDER — LIDOCAINE HCL (PF) 1 % IJ SOLN
INTRAMUSCULAR | Status: DC | PRN
  Administered 2018-09-02 (×2): 4 mL via EPIDURAL

## 2018-09-02 MED ORDER — OXYTOCIN 40 UNITS IN NORMAL SALINE INFUSION - SIMPLE MED
1.0000 m[IU]/min | INTRAVENOUS | Status: DC
  Administered 2018-09-02: 2 m[IU]/min via INTRAVENOUS
  Filled 2018-09-02: qty 1000

## 2018-09-02 MED ORDER — NALOXONE HCL 4 MG/10ML IJ SOLN
1.0000 ug/kg/h | INTRAVENOUS | Status: DC | PRN
  Filled 2018-09-02: qty 5

## 2018-09-02 MED ORDER — FENTANYL CITRATE (PF) 100 MCG/2ML IJ SOLN
INTRAMUSCULAR | Status: AC
  Filled 2018-09-02: qty 2

## 2018-09-02 MED ORDER — DIPHENHYDRAMINE HCL 50 MG/ML IJ SOLN: 12.5000 mg | INTRAMUSCULAR | Status: DC | PRN

## 2018-09-02 MED ORDER — PHENYLEPHRINE 40 MCG/ML (10ML) SYRINGE FOR IV PUSH (FOR BLOOD PRESSURE SUPPORT)
80.0000 ug | PREFILLED_SYRINGE | INTRAVENOUS | Status: DC | PRN
  Administered 2018-09-02: 10:00:00 80 ug via INTRAVENOUS

## 2018-09-02 MED ORDER — PHENYLEPHRINE 40 MCG/ML (10ML) SYRINGE FOR IV PUSH (FOR BLOOD PRESSURE SUPPORT)
80.0000 ug | PREFILLED_SYRINGE | INTRAVENOUS | Status: DC | PRN
  Filled 2018-09-02: qty 10

## 2018-09-02 MED ORDER — OXYTOCIN 10 UNIT/ML IJ SOLN
INTRAMUSCULAR | Status: AC
  Filled 2018-09-02: qty 4

## 2018-09-02 MED ORDER — NALBUPHINE HCL 10 MG/ML IJ SOLN: 5.0000 mg | INTRAMUSCULAR | Status: DC | PRN

## 2018-09-02 MED ORDER — CEFAZOLIN SODIUM-DEXTROSE 2-4 GM/100ML-% IV SOLN
2.0000 g | INTRAVENOUS | Status: AC
  Administered 2018-09-02: 2 g via INTRAVENOUS

## 2018-09-02 MED ORDER — PROMETHAZINE HCL 25 MG/ML IJ SOLN: 6.2500 mg | INTRAMUSCULAR | Status: DC | PRN

## 2018-09-02 MED ORDER — PHENYLEPHRINE 40 MCG/ML (10ML) SYRINGE FOR IV PUSH (FOR BLOOD PRESSURE SUPPORT)
PREFILLED_SYRINGE | INTRAVENOUS | Status: AC
  Filled 2018-09-02: qty 20

## 2018-09-02 MED ORDER — BUPIVACAINE HCL (PF) 0.25 % IJ SOLN
INTRAMUSCULAR | Status: AC
  Filled 2018-09-02: qty 30

## 2018-09-02 MED ORDER — SODIUM BICARBONATE 8.4 % IV SOLN
INTRAVENOUS | Status: DC | PRN
  Administered 2018-09-02 (×2): 5 mL via EPIDURAL

## 2018-09-02 MED ORDER — DEXAMETHASONE SODIUM PHOSPHATE 4 MG/ML IJ SOLN
INTRAMUSCULAR | Status: AC
  Filled 2018-09-02: qty 1

## 2018-09-02 MED ORDER — ONDANSETRON HCL 4 MG/2ML IJ SOLN
INTRAMUSCULAR | Status: AC
  Filled 2018-09-02: qty 4

## 2018-09-02 MED ORDER — MORPHINE SULFATE (PF) 0.5 MG/ML IJ SOLN
INTRAMUSCULAR | Status: DC | PRN
  Administered 2018-09-02: 3 mg via EPIDURAL

## 2018-09-02 MED ORDER — SODIUM BICARBONATE 8.4 % IV SOLN
INTRAVENOUS | Status: AC
  Filled 2018-09-02: qty 50

## 2018-09-02 MED ORDER — SODIUM CHLORIDE 0.9 % IV SOLN
INTRAVENOUS | Status: DC | PRN
  Administered 2018-09-02: 40 [IU] via INTRAVENOUS

## 2018-09-02 MED ORDER — LIDOCAINE-EPINEPHRINE (PF) 2 %-1:200000 IJ SOLN
INTRAMUSCULAR | Status: AC
  Filled 2018-09-02: qty 10

## 2018-09-02 MED ORDER — SOD CITRATE-CITRIC ACID 500-334 MG/5ML PO SOLN
30.0000 mL | ORAL | Status: AC
  Administered 2018-09-02: 30 mL via ORAL

## 2018-09-02 MED ORDER — NALBUPHINE HCL 10 MG/ML IJ SOLN: 5.0000 mg | Freq: Once | INTRAMUSCULAR | Status: DC | PRN

## 2018-09-02 MED ORDER — FENTANYL-BUPIVACAINE-NACL 0.5-0.125-0.9 MG/250ML-% EP SOLN
12.0000 mL/h | EPIDURAL | Status: DC | PRN
  Filled 2018-09-02: qty 250

## 2018-09-02 MED ORDER — TERBUTALINE SULFATE 1 MG/ML IJ SOLN: 0.2500 mg | Freq: Once | INTRAMUSCULAR | Status: DC | PRN

## 2018-09-02 MED ORDER — FENTANYL CITRATE (PF) 100 MCG/2ML IJ SOLN
INTRAMUSCULAR | Status: DC | PRN
  Administered 2018-09-02: 100 ug via EPIDURAL

## 2018-09-02 MED ORDER — FENTANYL CITRATE (PF) 100 MCG/2ML IJ SOLN
25.0000 ug | INTRAMUSCULAR | Status: DC | PRN
  Administered 2018-09-03: 01:00:00 50 ug via INTRAVENOUS

## 2018-09-02 MED ORDER — LACTATED RINGERS IV SOLN
INTRAVENOUS | Status: DC | PRN
  Administered 2018-09-02: 22:00:00 via INTRAVENOUS

## 2018-09-02 MED ORDER — CEFAZOLIN SODIUM-DEXTROSE 2-4 GM/100ML-% IV SOLN
INTRAVENOUS | Status: AC
  Filled 2018-09-02: qty 100

## 2018-09-02 SURGICAL SUPPLY — 38 items
BENZOIN TINCTURE PRP APPL 2/3 (GAUZE/BANDAGES/DRESSINGS) ×2 IMPLANT
CHLORAPREP W/TINT 26ML (MISCELLANEOUS) ×2 IMPLANT
CLAMP CORD UMBIL (MISCELLANEOUS) IMPLANT
CLOTH BEACON ORANGE TIMEOUT ST (SAFETY) ×2 IMPLANT
DECANTER SPIKE VIAL GLASS SM (MISCELLANEOUS) ×1 IMPLANT
DRSG OPSITE POSTOP 4X10 (GAUZE/BANDAGES/DRESSINGS) ×2 IMPLANT
ELECT REM PT RETURN 9FT ADLT (ELECTROSURGICAL) ×2
ELECTRODE REM PT RTRN 9FT ADLT (ELECTROSURGICAL) ×1 IMPLANT
EXTRACTOR VACUUM KIWI (MISCELLANEOUS) IMPLANT
GLOVE BIO SURGEON STRL SZ 6.5 (GLOVE) ×2 IMPLANT
GLOVE BIOGEL PI IND STRL 7.0 (GLOVE) ×2 IMPLANT
GLOVE BIOGEL PI INDICATOR 7.0 (GLOVE) ×2
GOWN STRL REUS W/TWL LRG LVL3 (GOWN DISPOSABLE) ×6 IMPLANT
KIT ABG SYR 3ML LUER SLIP (SYRINGE) IMPLANT
NDL HYPO 25X5/8 SAFETYGLIDE (NEEDLE) IMPLANT
NEEDLE HYPO 22GX1.5 SAFETY (NEEDLE) IMPLANT
NEEDLE HYPO 25X5/8 SAFETYGLIDE (NEEDLE) IMPLANT
NS IRRIG 1000ML POUR BTL (IV SOLUTION) ×2 IMPLANT
PACK C SECTION WH (CUSTOM PROCEDURE TRAY) ×2 IMPLANT
PAD ABD 7.5X8 STRL (GAUZE/BANDAGES/DRESSINGS) ×1 IMPLANT
PAD OB MATERNITY 4.3X12.25 (PERSONAL CARE ITEMS) ×2 IMPLANT
PENCIL SMOKE EVAC W/HOLSTER (ELECTROSURGICAL) ×2 IMPLANT
RTRCTR C-SECT PINK 25CM LRG (MISCELLANEOUS) ×2 IMPLANT
SPONGE GAUZE 4X4 12PLY STER LF (GAUZE/BANDAGES/DRESSINGS) ×2 IMPLANT
STRIP CLOSURE SKIN 1/2X4 (GAUZE/BANDAGES/DRESSINGS) ×3 IMPLANT
SUT CHROMIC 2 0 CT 1 (SUTURE) ×5 IMPLANT
SUT MNCRL 0 VIOLET CTX 36 (SUTURE) ×2 IMPLANT
SUT MONOCRYL 0 CTX 36 (SUTURE) ×2
SUT PDS AB 0 CTX 36 PDP370T (SUTURE) IMPLANT
SUT PLAIN 2 0 (SUTURE)
SUT PLAIN ABS 2-0 CT1 27XMFL (SUTURE) IMPLANT
SUT VIC AB 0 CTX 36 (SUTURE) ×2
SUT VIC AB 0 CTX36XBRD ANBCTRL (SUTURE) ×2 IMPLANT
SUT VIC AB 4-0 KS 27 (SUTURE) ×3 IMPLANT
SYR CONTROL 10ML LL (SYRINGE) IMPLANT
TOWEL OR 17X24 6PK STRL BLUE (TOWEL DISPOSABLE) ×2 IMPLANT
TRAY FOLEY W/BAG SLVR 14FR LF (SET/KITS/TRAYS/PACK) ×2 IMPLANT
WATER STERILE IRR 1000ML POUR (IV SOLUTION) ×3 IMPLANT

## 2018-09-02 NOTE — Progress Notes (Signed)
MD Preoperative Op Note  Evaluated patient at bedside.  She has been pushing effectively for 2.5 hours and reports exhaustion. She does not desire to continue to push and would prefer to have a cesarean delivery.   Procedure: Primary cesarean delivery  Indication: Arrest of descent 10 / 10 / -1 station (caput forming) possible OP fetal position and maternal exhaustion  Discussed risks of procedure to include hemorrhage, infection, injury to other internal organs, fetal injury.  Patient voiced understanding and agrees to proceed.   Consent signed, witnessed and placed into chart.    Norma Newman [name]

## 2018-09-02 NOTE — Progress Notes (Signed)
   Subjective: Patient is comfortable with her epidural .. SROM this morning clear fluid at approximately 730 am .   Objective: BP (!) 134/93 (BP Location: Right Arm)   Pulse 96   Temp 98.7 F (37.1 C) (Axillary) Comment (Src): sipping cold drink  Resp 18   Ht 4\' 10"  (1.473 m)   Wt 58.1 kg   SpO2 99%   BMI 26.75 kg/m  No intake/output data recorded. Total I/O In: -  Out: 93 [Urine:50]  FHT:  FHR: 140 bpm, variability: minimal ,  accelerations:  Present,  decelerations:  Absent UC:   regular, every 2 minutes SVE:   Dilation: 8 Effacement (%): 90 Station: -1 Exam by:: Dr. Landry Mellow  Labs: Lab Results  Component Value Date   WBC 12.8 (H) 09/02/2018   HGB 12.5 09/02/2018   HCT 35.7 (L) 09/02/2018   MCV 91.3 09/02/2018   PLT 189 09/02/2018    Assessment / Plan: Induction of labor due to preeclampsia,  progressing well on pitocin  Labor: Progressing normally Preeclampsia:  labs stable Fetal Wellbeing:  Category I Pain Control:  Epidural I/D:  n/a Anticipated MOD:  NSVD  Christophe Louis 09/02/2018, 1:51 PM

## 2018-09-02 NOTE — Progress Notes (Signed)
Norma Newman is a 38 y.o. G1P0000 at [redacted]w[redacted]d  Admitted for induction of labor due to preeclampsia without severe features  Subjective: Patient is  Comfortable with epidural. Continued LOF   Objective: BP (!) 145/88   Pulse 97   Temp 99.4 F (37.4 C) (Axillary)   Resp 20   Ht 4\' 10"  (1.473 m)   Wt 58.1 kg   SpO2 99%   BMI 26.75 kg/m  No intake/output data recorded. Total I/O In: 4689.7 [P.O.:270; I.V.:4335.7; Other:84] Out: 325 [Urine:325]  FHT:  FHR: 140 bpm, variability: moderate,  accelerations:  Present,  decelerations:  Absent UC:   regular, every 2 minutes SVE:   Dilation: Lip/rim Effacement (%): 100 Station: Plus 1 Exam by:: Dr. Landry Mellow  Labs: Lab Results  Component Value Date   WBC 12.8 (H) 09/02/2018   HGB 12.5 09/02/2018   HCT 35.7 (L) 09/02/2018   MCV 91.3 09/02/2018   PLT 189 09/02/2018    Assessment / Plan: Induction of labor due to preeclampsia,  progressing well on pitocin  Labor: Progressing normally Preeclampsia:   Creatinine increased to 1.08 with decreased urine output.. monitor closely.. plan magnesium post delivery for 24 hours  Fetal Wellbeing:  Category I Pain Control:  Epidural I/D:  n/a Anticipated MOD:  NSVD   CCOB MW and Dr . Alwyn Pea assuming care after 7 pm   Christophe Louis 09/02/2018, 5:44 PM

## 2018-09-02 NOTE — Transfer of Care (Signed)
Immediate Anesthesia Transfer of Care Note  Patient: Norma Newman  Procedure(s) Performed: CESAREAN SECTION (N/A )  Patient Location: PACU  Anesthesia Type:Epidural  Level of Consciousness: awake, alert  and oriented  Airway & Oxygen Therapy: Patient Spontanous Breathing  Post-op Assessment: Report given to RN and Post -op Vital signs reviewed and stable  Post vital signs: Reviewed and stable  Last Vitals:  Vitals Value Taken Time  BP 120/103 09/02/18 2339  Temp    Pulse 89 09/02/18 2341  Resp 19 09/02/18 2341  SpO2 97 % 09/02/18 2341  Vitals shown include unvalidated device data.  Last Pain:  Vitals:   09/02/18 2200  TempSrc:   PainSc: 0-No pain      Patients Stated Pain Goal: 10 (29/56/21 3086)  Complications: No apparent anesthesia complications

## 2018-09-02 NOTE — Op Note (Signed)
Cesarean Section Procedure Note  Norma Newman  DOB:    [birth year]  MRN:    [id number]  Date of Surgery:  09/02/2018  Indication: 54 week SIUP admitted for IOL for preeclampsia with severe features now being taken back to operating room for arrest of descent and maternal exhaustion   Pre-operative Diagnosis: Arrest of descent  Post-operative Diagnosis: same  Procedure:  Primary cesarean delivery                         Surgeon: Caffie Damme, MD  Assistants: None  Anesthesia: Epidural anesthesia  ASA Class: 2  Procedure Details   The patient was counseled about the risks, benefits, complications of the cesarean section. The patient concurred with the proposed plan, giving informed consent.  The site of surgery properly noted/marked. The patient was taken to Operating Room # C, identified as Norma Newman and the procedure verified as C-Section Delivery. A Time Out was held and the above information confirmed.  After epidural was found to adequate , the patient was placed in the dorsal supine position with a leftward tilt, draped and prepped in the usual sterile manner. A Pfannenstiel incision was made with a 10 blade scalpel and the incision carried down through the subcutaneous tissue to the fascia.  The fascia was incised in the midline and the fascial incision was extended laterally with Mayo scissors. The superior aspect of the fascial incision was grasped with Coker clamps x2, tented up and the rectus muscles dissected off sharply with the bovie.  The rectus was then dissected off with blunt dissection and the bovie inferiorly. The rectus muscles were separated in the midline. The abdominal peritoneum was identified, and bluntly entered using surgeons fingers. The peritoneal opening was bluntly extended with gentle pulling.   The Alexis retractor was then deployed. The vesicouterine peritoneum was identified, tented up, entered sharply with Metzenbaum scissors, and the  bladder flap was created digitally. Scalpel was then used to make a low transverse incision on the uterus which was extended laterally with  blunt dissection. The fetal vertex was identified and delivered from cephalic presentation (occiput posterior).  A live healthy Female with Apgar scores of 8 at one minute and 9 at five minutes. After the umbilical cord was clamped and cut, the baby was handed off to waiting Pediatricians. cord blood was obtained for evaluation. The placenta was spontaneously removed intact. The placenta was handed off to be sent to L&D.   The uterus was cleared of all clot and debris. The uterine incision was repaired with #0 Monocryl in running locked fashion. A second imbricating suture was performed using the same suture. The incision was hemostatic. Ovaries and tubes were inspected and normal. The Alexis retractor was removed. The abdominal cavity was cleared of all clot and debris. The abdominal peritoneum was reapproximated with 2-0 chromic  in a running fashion, the rectus muscles was reapproximated with #2 chromic in interrupted fashion. The fascia was closed with 0 Vicryl in a running fashion. The subcuticular layer was irrigated and all bleeders cauterized.  30 mL of 0.5% Marcaine was injected into the subcutaneous layer.  The Scarpas fascia was re-approximated with interrupted sutures of 2-0 plain gut.   The skin was closed with 4-0 vicryl in a subcuticular fashion using a Lanny Hurst needle. The incision was dressed with benzoine, steri strips and pressure dressing. All sponge lap and needle counts were correct x3.   Patient tolerated the procedure well and  recovered in stable condition following the procedure.  Instrument, sponge, and needle counts were correct prior the abdominal closure and at the conclusion of the case.   Findings: Live female infant, Apgars 8/9, clear amniotic fluid, placenta intact 3 vessels, normal uterus, bilateral tubes and ovaries  Estimated Blood  Loss: 279mL  IVF:  1300 mL LR         Drains: Foley catheter  Urine output: 50 mL blood tinged          Specimens: Placenta to L&D          Implants: none         Complications:  None; patient tolerated the procedure well.         Disposition: PACU - hemodynamically stable.   Avantae Bither Norma Newman

## 2018-09-02 NOTE — Progress Notes (Signed)
OB PN:  S: Pt resting comfortably, feeling mild contractions- rates her pain 1/10.  No headache, no blurry vision, no acute complaints  O: BP 133/78   Pulse 77   Temp 98.8 F (37.1 C) (Oral)   Resp 18   Ht 4\' 10"  (1.473 m)   Wt 58.1 kg   SpO2 99%   BMI 26.75 kg/m   BP range: 130-152/75-90  FHT: 140bpm, moderate variablity, + accels, no decels Toco: q54min SVE: 2/50/-3, Cook balloon placed- 50cc  A/P: 38 y.o. G1P0 @ [redacted]w[redacted]d for IOL due to preeclampsia- no severe features 1. FWB: Cat. I 2. Labor: s/p cytotec #2, Cook balloon placed, plan to transition to Pitocin Pain: IV or epidural upon request GBS: negative Preeclampsia: pt asymptomatic, BP stable, will continue to closely monitor, repeat labs in am  Janyth Pupa, DO [phone] (cell) [phone] (office)

## 2018-09-02 NOTE — Progress Notes (Signed)
Norma Newman is a 38 y.o. G1P0000 at [redacted]w[redacted]d by LMP admitted for induction of labor due to Pre-eclamptic toxemia of pregnancy..  Subjective: Patient feeling more pressure  Objective: BP 134/85 (BP Location: Left Arm)   Pulse 97   Temp 98.8 F (37.1 C) (Oral)   Resp 18   Ht 4\' 10"  (1.473 m)   Wt 58.1 kg   SpO2 99%   BMI 26.75 kg/m  I/O last 3 completed shifts: In: 4845.7 [P.O.:270; I.V.:4479.7; Other:96] Out: 325 [Urine:325] No intake/output data recorded.  FHT:  FHR: 145 bpm, variability: moderate,  accelerations:  Present,  decelerations:  Present early variable decels with contractions UC:   regular, every 2-3 minutes SVE:   Dilation: 10 Effacement (%): 100 Station: 0 Exam by:: Dr. Alwyn Pea  Labs: Lab Results  Component Value Date   WBC 12.8 (H) 09/02/2018   HGB 12.5 09/02/2018   HCT 35.7 (L) 09/02/2018   MCV 91.3 09/02/2018   PLT 189 09/02/2018    Assessment / Plan: Induction of labor due to preeclampsia,  progressing well on pitocin Now in second stage of labor Pushing initiated  Labor: Progressing on Pitocin Preeclampsia:  no signs or symptoms of toxicity and intake and ouput balanced Fetal Wellbeing:  Category II Pain Control:  Epidural I/D:  n/a Anticipated MOD:  NSVD  Trang Bouse, [place] 09/02/2018, 8:03 PM

## 2018-09-02 NOTE — Progress Notes (Signed)
At 1415 patient called this RN into room,  reported sudden pain, sharp in nature, in right Epigastric area, with patient pointing to area under right breast.  Pain lasted 6-8 minutes while RN with patient.  Immediate assessment found one beat clonus left foot, none in right, 3+ reflexes bilaterally, with minimal change in edema. Patient denied having a headache, blurry vision, or seeing spots. BP readings found on pt's flowsheet.  Lungs clear bilaterally, heart sounds with S1, S2. Urine output 75 ml dark amber urine in foley bag.    Changed patient's position from lying on right side to low-Fowler's  for cervical exam, then to her left side. Patient then reported pain had "gone away", continuing to deny return of pain when RN asked her.     Called Dr. Landry Mellow to report all of above, orders received. Will call lab results to Dr. Landry Mellow.

## 2018-09-02 NOTE — Anesthesia Procedure Notes (Signed)
Epidural Patient location during procedure: OB Start time: 09/02/2018 8:37 AM End time: 09/02/2018 8:52 AM  Staffing Anesthesiologist: Nolon Nations, MD Performed: anesthesiologist   Preanesthetic Checklist Completed: patient identified, pre-op evaluation, timeout performed, IV checked, risks and benefits discussed and monitors and equipment checked  Epidural Patient position: sitting Prep: site prepped and draped and DuraPrep Patient monitoring: heart rate, continuous pulse ox and blood pressure Approach: midline Location: L2-L3 Injection technique: LOR air and LOR saline  Needle:  Needle type: Tuohy  Needle gauge: 17 G Needle length: 9 cm Needle insertion depth: 5 cm Catheter type: closed end flexible Catheter size: 19 Gauge Catheter at skin depth: 10 cm Test dose: negative  Assessment Sensory level: T8 Events: blood not aspirated, injection not painful, no injection resistance, negative IV test and no paresthesia  Additional Notes Reason for block:procedure for pain

## 2018-09-02 NOTE — Anesthesia Preprocedure Evaluation (Addendum)
Anesthesia Evaluation  Patient identified by MRN, date of birth, ID band Patient awake    Reviewed: Allergy & Precautions, Patient's Chart, lab work & pertinent test results  Airway Mallampati: II   Neck ROM: Full    Dental no notable dental hx. (+) Dental Advisory Given   Pulmonary neg pulmonary ROS,    Pulmonary exam normal        Cardiovascular hypertension, Normal cardiovascular exam     Neuro/Psych negative neurological ROS  negative psych ROS   GI/Hepatic negative GI ROS, Neg liver ROS,   Endo/Other  diabetesHypothyroidism   Renal/GU negative Renal ROS     Musculoskeletal negative musculoskeletal ROS (+)   Abdominal   Peds  Hematology negative hematology ROS (+)   Anesthesia Other Findings   Reproductive/Obstetrics (+) Pregnancy                            Anesthesia Physical Anesthesia Plan  ASA: III  Anesthesia Plan: Epidural   Post-op Pain Management:    Induction:   PONV Risk Score and Plan:   Airway Management Planned:   Additional Equipment:   Intra-op Plan:   Post-operative Plan:   Informed Consent: I have reviewed the patients History and Physical, chart, labs and discussed the procedure including the risks, benefits and alternatives for the proposed anesthesia with the patient or authorized representative who has indicated his/her understanding and acceptance.       Plan Discussed with:   Anesthesia Plan Comments:         Anesthesia Quick Evaluation

## 2018-09-03 ENCOUNTER — Encounter (HOSPITAL_COMMUNITY): Payer: Self-pay | Admitting: Obstetrics & Gynecology

## 2018-09-03 LAB — CBC
HCT: 33.4 % — ABNORMAL LOW (ref 36.0–46.0)
HCT: 33.7 % — ABNORMAL LOW (ref 36.0–46.0)
Hemoglobin: 11.7 g/dL — ABNORMAL LOW (ref 12.0–15.0)
Hemoglobin: 11.8 g/dL — ABNORMAL LOW (ref 12.0–15.0)
MCH: 32.2 pg (ref 26.0–34.0)
MCH: 32.5 pg (ref 26.0–34.0)
MCHC: 35 g/dL (ref 30.0–36.0)
MCHC: 35 g/dL (ref 30.0–36.0)
MCV: 92 fL (ref 80.0–100.0)
MCV: 92.8 fL (ref 80.0–100.0)
Platelets: 174 K/uL (ref 150–400)
Platelets: 203 10*3/uL (ref 150–400)
RBC: 3.63 MIL/uL — ABNORMAL LOW (ref 3.87–5.11)
RBC: 3.63 MIL/uL — ABNORMAL LOW (ref 3.87–5.11)
RDW: 13.1 % (ref 11.5–15.5)
RDW: 13.2 % (ref 11.5–15.5)
WBC: 18.6 K/uL — ABNORMAL HIGH (ref 4.0–10.5)
WBC: 19.4 10*3/uL — ABNORMAL HIGH (ref 4.0–10.5)
nRBC: 0 % (ref 0.0–0.2)
nRBC: 0 % (ref 0.0–0.2)

## 2018-09-03 LAB — COMPREHENSIVE METABOLIC PANEL
ALT: 18 U/L (ref 0–44)
AST: 29 U/L (ref 15–41)
Albumin: 2 g/dL — ABNORMAL LOW (ref 3.5–5.0)
Alkaline Phosphatase: 88 U/L (ref 38–126)
Anion gap: 10 (ref 5–15)
BUN: 17 mg/dL (ref 6–20)
CO2: 18 mmol/L — ABNORMAL LOW (ref 22–32)
Calcium: 7.3 mg/dL — ABNORMAL LOW (ref 8.9–10.3)
Chloride: 106 mmol/L (ref 98–111)
Creatinine, Ser: 0.97 mg/dL (ref 0.44–1.00)
GFR calc Af Amer: >60 mL/min (ref >60)
GFR calc non Af Amer: >60 mL/min (ref >60)
Glucose, Bld: 113 mg/dL — ABNORMAL HIGH (ref 70–99)
Potassium: 4.3 mmol/L (ref 3.5–5.1)
Sodium: 134 mmol/L — ABNORMAL LOW (ref 135–145)
Total Bilirubin: 0.7 mg/dL (ref 0.3–1.2)
Total Protein: 4.4 g/dL — ABNORMAL LOW (ref 6.5–8.1)

## 2018-09-03 LAB — MAGNESIUM: Magnesium: 6.6 mg/dL (ref 1.7–2.4)

## 2018-09-03 MED ORDER — ACETAMINOPHEN 325 MG PO TABS
650.0000 mg | ORAL_TABLET | Freq: Four times a day (QID) | ORAL | Status: DC | PRN
  Administered 2018-09-04 (×2): 650 mg via ORAL
  Filled 2018-09-03 (×3): qty 2

## 2018-09-03 MED ORDER — ONDANSETRON HCL 4 MG/2ML IJ SOLN: 4.0000 mg | Freq: Four times a day (QID) | INTRAMUSCULAR | Status: DC | PRN

## 2018-09-03 MED ORDER — DIPHENHYDRAMINE HCL 25 MG PO CAPS: 25.0000 mg | ORAL_CAPSULE | Freq: Four times a day (QID) | ORAL | Status: DC | PRN

## 2018-09-03 MED ORDER — HYDRALAZINE HCL 20 MG/ML IJ SOLN: 10.0000 mg | INTRAMUSCULAR | Status: DC | PRN

## 2018-09-03 MED ORDER — MAGNESIUM SULFATE 40 G IN LACTATED RINGERS - SIMPLE
2.0000 g/h | INTRAVENOUS | Status: AC
  Administered 2018-09-03: 2 g/h via INTRAVENOUS

## 2018-09-03 MED ORDER — SIMETHICONE 80 MG PO CHEW: 80.0000 mg | CHEWABLE_TABLET | ORAL | Status: DC

## 2018-09-03 MED ORDER — HYDROMORPHONE HCL 1 MG/ML IJ SOLN: 0.2000 mg | INTRAMUSCULAR | Status: DC | PRN

## 2018-09-03 MED ORDER — FENTANYL CITRATE (PF) 100 MCG/2ML IJ SOLN
INTRAMUSCULAR | Status: AC
  Filled 2018-09-03: qty 2

## 2018-09-03 MED ORDER — LABETALOL HCL 5 MG/ML IV SOLN: 40.0000 mg | INTRAVENOUS | Status: DC | PRN

## 2018-09-03 MED ORDER — LABETALOL HCL 5 MG/ML IV SOLN: 20.0000 mg | INTRAVENOUS | Status: DC | PRN

## 2018-09-03 MED ORDER — DIBUCAINE (PERIANAL) 1 % EX OINT: 1.0000 "application " | TOPICAL_OINTMENT | CUTANEOUS | Status: DC | PRN

## 2018-09-03 MED ORDER — LACTATED RINGERS IV SOLN
INTRAVENOUS | Status: DC
  Administered 2018-09-03: 12:00:00 via INTRAVENOUS

## 2018-09-03 MED ORDER — OXYCODONE HCL 5 MG PO TABS
5.0000 mg | ORAL_TABLET | Freq: Four times a day (QID) | ORAL | Status: DC | PRN
  Administered 2018-09-04 – 2018-09-05 (×4): 5 mg via ORAL
  Filled 2018-09-03 (×4): qty 1

## 2018-09-03 MED ORDER — SENNOSIDES-DOCUSATE SODIUM 8.6-50 MG PO TABS
2.0000 | ORAL_TABLET | ORAL | Status: DC
  Administered 2018-09-04 (×2): 2 via ORAL
  Filled 2018-09-03 (×2): qty 2

## 2018-09-03 MED ORDER — OXYTOCIN 40 UNITS IN NORMAL SALINE INFUSION - SIMPLE MED: 2.5000 [IU]/h | INTRAVENOUS | Status: AC

## 2018-09-03 MED ORDER — TETANUS-DIPHTH-ACELL PERTUSSIS 5-2.5-18.5 LF-MCG/0.5 IM SUSP: 0.5000 mL | Freq: Once | INTRAMUSCULAR | Status: DC

## 2018-09-03 MED ORDER — LABETALOL HCL 5 MG/ML IV SOLN: 80.0000 mg | INTRAVENOUS | Status: DC | PRN

## 2018-09-03 MED ORDER — MAGNESIUM SULFATE BOLUS VIA INFUSION
4.0000 g | Freq: Once | INTRAVENOUS | Status: AC
  Administered 2018-09-03: 4 g via INTRAVENOUS
  Filled 2018-09-03: qty 500

## 2018-09-03 MED ORDER — COCONUT OIL OIL: 1.0000 "application " | TOPICAL_OIL | Status: DC | PRN

## 2018-09-03 MED ORDER — SIMETHICONE 80 MG PO CHEW
80.0000 mg | CHEWABLE_TABLET | ORAL | Status: DC | PRN
  Administered 2018-09-03: 80 mg via ORAL
  Filled 2018-09-03: qty 1

## 2018-09-03 MED ORDER — PRENATAL MULTIVITAMIN CH
1.0000 | ORAL_TABLET | Freq: Every day | ORAL | Status: DC
  Administered 2018-09-03 – 2018-09-04 (×2): 1 via ORAL
  Filled 2018-09-03 (×2): qty 1

## 2018-09-03 MED ORDER — KETOROLAC TROMETHAMINE 30 MG/ML IJ SOLN
30.0000 mg | Freq: Four times a day (QID) | INTRAMUSCULAR | Status: AC
  Administered 2018-09-03 (×2): 30 mg via INTRAVENOUS
  Filled 2018-09-03 (×2): qty 1

## 2018-09-03 MED ORDER — SIMETHICONE 80 MG PO CHEW: 80.0000 mg | CHEWABLE_TABLET | Freq: Three times a day (TID) | ORAL | Status: DC

## 2018-09-03 MED ORDER — KETOROLAC TROMETHAMINE 30 MG/ML IJ SOLN
30.0000 mg | Freq: Four times a day (QID) | INTRAMUSCULAR | Status: DC
  Administered 2018-09-03: 07:00:00 30 mg via INTRAVENOUS
  Filled 2018-09-03: qty 1

## 2018-09-03 MED ORDER — IBUPROFEN 600 MG PO TABS
600.0000 mg | ORAL_TABLET | Freq: Four times a day (QID) | ORAL | Status: DC
  Administered 2018-09-04 – 2018-09-05 (×6): 600 mg via ORAL
  Filled 2018-09-03 (×6): qty 1

## 2018-09-03 MED ORDER — ZOLPIDEM TARTRATE 5 MG PO TABS: 5.0000 mg | ORAL_TABLET | Freq: Every evening | ORAL | Status: DC | PRN

## 2018-09-03 MED ORDER — WITCH HAZEL-GLYCERIN EX PADS: 1.0000 "application " | MEDICATED_PAD | CUTANEOUS | Status: DC | PRN

## 2018-09-03 MED ORDER — MENTHOL 3 MG MT LOZG: 1.0000 | LOZENGE | OROMUCOSAL | Status: DC | PRN

## 2018-09-03 MED ORDER — MAGNESIUM SULFATE 40 G IN LACTATED RINGERS - SIMPLE
INTRAVENOUS | Status: AC
  Filled 2018-09-03: qty 500

## 2018-09-03 MED ORDER — IBUPROFEN 800 MG PO TABS: 800.0000 mg | ORAL_TABLET | Freq: Four times a day (QID) | ORAL | Status: DC

## 2018-09-03 MED ORDER — OXYCODONE-ACETAMINOPHEN 5-325 MG PO TABS: 1.0000 | ORAL_TABLET | ORAL | Status: DC | PRN

## 2018-09-03 NOTE — Progress Notes (Signed)
Dr Nelda Marseille at bedside. Verbal order to leave foley catheter in until output increases. Plan to reassess need for foley later today.

## 2018-09-03 NOTE — Progress Notes (Signed)
Postop Note Day # 1  S:  Patient resting comfortable in bed.  Pain controlled- notes some discomfort with movement.  Tolerating clears. No flatus, no BM.  Lochia minimal. Pt has not yet ambulated.  She denies n/v/f/c, SOB, or CP.  Pt plans on breastfeeding.  Foley in place  O: Temp:  [98.1 F (36.7 C)-101 F (38.3 C)] 98.8 F (37.1 C) (06/24 0143) Pulse Rate:  [72-167] 95 (06/24 0431) Resp:  [12-28] 16 (06/24 0630) BP: (95-157)/(71-108) 133/84 (06/24 0431) SpO2:  [92 %-100 %] 95 % (06/24 0655)  UOP: 400cc/8hr  Gen: A&Ox3, NAD CV: RRR, no MRG Resp: CTAB Abdomen: soft, NT, ND, BS quiet Uterus: firm, non-tender, below umbilicus Incision: c/d/i, bandage on Ext: 2+ edema, no calf tenderness bilaterally, SCDs in place  Labs:  Results for orders placed or performed during the hospital encounter of 09/01/18 (from the past 24 hour(s))  CBC     Status: Abnormal   Collection Time: 09/02/18  7:57 AM  Result Value Ref Range   WBC 12.8 (H) 4.0 - 10.5 K/uL   RBC 3.91 3.87 - 5.11 MIL/uL   Hemoglobin 12.5 12.0 - 15.0 g/dL   HCT 35.7 (L) 36.0 - 46.0 %   MCV 91.3 80.0 - 100.0 fL   MCH 32.0 26.0 - 34.0 pg   MCHC 35.0 30.0 - 36.0 g/dL   RDW 13.1 11.5 - 15.5 %   Platelets 189 150 - 400 K/uL   nRBC 0.0 0.0 - 0.2 %  Comprehensive metabolic panel     Status: Abnormal   Collection Time: 09/02/18  7:57 AM  Result Value Ref Range   Sodium 137 135 - 145 mmol/L   Potassium 4.3 3.5 - 5.1 mmol/L   Chloride 110 98 - 111 mmol/L   CO2 19 (L) 22 - 32 mmol/L   Glucose, Bld 87 70 - 99 mg/dL   BUN 12 6 - 20 mg/dL   Creatinine, Ser 0.76 0.44 - 1.00 mg/dL   Calcium 8.3 (L) 8.9 - 10.3 mg/dL   Total Protein 4.9 (L) 6.5 - 8.1 g/dL   Albumin 2.3 (L) 3.5 - 5.0 g/dL   AST 26 15 - 41 U/L   ALT 19 0 - 44 U/L   Alkaline Phosphatase 105 38 - 126 U/L   Total Bilirubin 0.7 0.3 - 1.2 mg/dL   GFR calc non Af Amer >60 >60 mL/min   GFR calc Af Amer >60 >60 mL/min   Anion gap 8 5 - 15  Comprehensive metabolic panel      Status: Abnormal   Collection Time: 09/02/18  2:45 PM  Result Value Ref Range   Sodium 137 135 - 145 mmol/L   Potassium 5.0 3.5 - 5.1 mmol/L   Chloride 110 98 - 111 mmol/L   CO2 18 (L) 22 - 32 mmol/L   Glucose, Bld 101 (H) 70 - 99 mg/dL   BUN 17 6 - 20 mg/dL   Creatinine, Ser 1.08 (H) 0.44 - 1.00 mg/dL   Calcium 7.9 (L) 8.9 - 10.3 mg/dL   Total Protein 5.1 (L) 6.5 - 8.1 g/dL   Albumin 2.3 (L) 3.5 - 5.0 g/dL   AST 28 15 - 41 U/L   ALT 20 0 - 44 U/L   Alkaline Phosphatase 104 38 - 126 U/L   Total Bilirubin 0.6 0.3 - 1.2 mg/dL   GFR calc non Af Amer >60 >60 mL/min   GFR calc Af Amer >60 >60 mL/min   Anion gap 9 5 -  15  Glucose, capillary     Status: None   Collection Time: 09/02/18 11:49 PM  Result Value Ref Range   Glucose-Capillary 97 70 - 99 mg/dL  CBC     Status: Abnormal   Collection Time: 09/03/18 12:03 AM  Result Value Ref Range   WBC 19.4 (H) 4.0 - 10.5 K/uL   RBC 3.63 (L) 3.87 - 5.11 MIL/uL   Hemoglobin 11.7 (L) 12.0 - 15.0 g/dL   HCT 33.4 (L) 36.0 - 46.0 %   MCV 92.0 80.0 - 100.0 fL   MCH 32.2 26.0 - 34.0 pg   MCHC 35.0 30.0 - 36.0 g/dL   RDW 13.1 11.5 - 15.5 %   Platelets 203 150 - 400 K/uL   nRBC 0.0 0.0 - 0.2 %  CBC     Status: Abnormal   Collection Time: 09/03/18  4:47 AM  Result Value Ref Range   WBC 18.6 (H) 4.0 - 10.5 K/uL   RBC 3.63 (L) 3.87 - 5.11 MIL/uL   Hemoglobin 11.8 (L) 12.0 - 15.0 g/dL   HCT 33.7 (L) 36.0 - 46.0 %   MCV 92.8 80.0 - 100.0 fL   MCH 32.5 26.0 - 34.0 pg   MCHC 35.0 30.0 - 36.0 g/dL   RDW 13.2 11.5 - 15.5 %   Platelets 174 150 - 400 K/uL   nRBC 0.0 0.0 - 0.2 %  Comprehensive metabolic panel     Status: Abnormal   Collection Time: 09/03/18  4:47 AM  Result Value Ref Range   Sodium 134 (L) 135 - 145 mmol/L   Potassium 4.3 3.5 - 5.1 mmol/L   Chloride 106 98 - 111 mmol/L   CO2 18 (L) 22 - 32 mmol/L   Glucose, Bld 113 (H) 70 - 99 mg/dL   BUN 17 6 - 20 mg/dL   Creatinine, Ser 0.97 0.44 - 1.00 mg/dL   Calcium 7.3 (L) 8.9 - 10.3  mg/dL   Total Protein 4.4 (L) 6.5 - 8.1 g/dL   Albumin 2.0 (L) 3.5 - 5.0 g/dL   AST 29 15 - 41 U/L   ALT 18 0 - 44 U/L   Alkaline Phosphatase 88 38 - 126 U/L   Total Bilirubin 0.7 0.3 - 1.2 mg/dL   GFR calc non Af Amer >60 >60 mL/min   GFR calc Af Amer >60 >60 mL/min   Anion gap 10 5 - 15     A/P: Pt is a 38 y.o. G1P1 s/p primary C-section, POD#1  -Preeclampsia  Currently on Magnesium x 24hr, BP within normal limits, no BP medication currently  Plan to continue Foley while on Magnesium, UOP as above, continue to closely monitor I/Os  Labs stable as above, plan to continue with daily labs  Pt remains asymptomatic - Pain controlled -GU: continue with foley, adequate UOP -GI: Tolerating general diet -Activity: encouraged sitting up to chair and ambulation as tolerated -Prophylaxis: SCDs in place -Labs: stable as above  Continue with postop care as outlined above  Janyth Pupa, DO [phone] (cell) [phone] (office)

## 2018-09-03 NOTE — Progress Notes (Signed)
Stopped Magnesium per order. Tried to get her up to use the bathroom, pt states she feels dizzy, nauseous, and vomited. Still has foley catheter. Will reassess for removal of foley again when she is able to walk to the bathroom.

## 2018-09-03 NOTE — Lactation Note (Addendum)
This note was copied from a baby's chart. Lactation Consultation Note  Patient Name: Girl Emryn Flanery ALPFX'T Date: 09/03/2018 Reason for consult: 1st time breastfeeding;Infant < 6lbs;Late-preterm 34-36.6wks P1, 7 hour female infan, mom had hx GDM, Mag and C/S delivery.  Per mom, infant had one stool. Mom current feeding choice is breast and formula feeding. Mom had 4 bottles on Similac Neosure 22 kcal with iron in her room.  Infant has been spitty had 3 episodes of emesis since delivery. Per mom, infant latched for 20 minutes in L&D but not in room been very sleepy. Mom made a few attempts to latch infant to breast. Mom attempted to latch infant on left breast using cross cradle hold, but infant only held breast in mouth and not suckle at this time.  Mom taught back hand expression and was given 5 ml of colostrum by spoon. [place] notice mom has edema in breast and feet, mom was given breast shells to help with latch due mom nipples being semi-flat due to the edema. Mom knows to wear  breast shells in her bra during  the day and not at night. Mom knows to breastfeed infant according hunger cues, 8 to 12 times within 24 hours and on demand . LC discussed I & O. Mom will work towards latching infant to breast. Mom knows to call Nurse or [facility] [place] if she needs assistance with latching infant to breast. Mom will breastfeed first, then offer EBM that she pumped from DEBP and the offer 22 calories based on infant;s age/ hours of life.  Mom plans to use DEBP every 3 hours on initial setting for 15 minutes. Mom shown how to use DEBP & how to disassemble, clean, & reassemble parts. Mom made aware of O/P services, breastfeeding support groups, community resources, and our phone # for post-discharge questions.    Maternal Data Formula Feeding for Exclusion: No Has patient been taught Hand Expression?: Yes(Mom hand expressed and infant was given 5 ml of EBM.) Does the patient have breastfeeding experience  prior to this delivery?: No  Feeding Feeding Type: Breast Fed  LATCH Score Latch: Too sleepy or reluctant, no latch achieved, no sucking elicited.  Audible Swallowing: None  Type of Nipple: Everted at rest and after stimulation  Comfort (Breast/Nipple): Soft / non-tender  Hold (Positioning): Assistance needed to correctly position infant at breast and maintain latch.  LATCH Score: 5  Interventions Interventions: Breast feeding basics reviewed;Breast compression;Assisted with latch;Adjust position;Skin to skin;Support pillows;DEBP;Hand pump;Breast massage;Position options;Hand express;Expressed milk;Pre-pump if needed;Reverse pressure  Lactation Tools Discussed/Used Tools: Shells;Pump Shell Type: (flat nipples with swelling (edema)) Pump Review: Setup, frequency, and cleaning;Milk Storage Initiated by:: Vicente Serene, IBCLC Date initiated:: 09/03/18   Consult Status Consult Status: Follow-up Date: 09/03/18 Follow-up type: In-patient    Vicente Serene 09/03/2018, 6:41 AM

## 2018-09-03 NOTE — Anesthesia Postprocedure Evaluation (Signed)
Anesthesia Post Note  Patient: Arayna Illescas  Procedure(s) Performed: CESAREAN SECTION (N/A )     Patient location during evaluation: PACU Anesthesia Type: Epidural Level of consciousness: awake and alert Pain management: pain level controlled Vital Signs Assessment: post-procedure vital signs reviewed and stable Respiratory status: spontaneous breathing and respiratory function stable Cardiovascular status: blood pressure returned to baseline and stable Postop Assessment: spinal receding Anesthetic complications: no    Last Vitals:  Vitals:   09/03/18 0030 09/03/18 0045  BP: 132/71 133/83  Pulse: 95 97  Resp: (!) 21 20  Temp:  37.3 C  SpO2: 96% 95%    Last Pain:  Vitals:   09/03/18 0045  TempSrc: Oral  PainSc: 0-No pain   Pain Goal: Patients Stated Pain Goal: 10 (09/02/18 0732)  LLE Motor Response: Non-purposeful movement (09/03/18 0015) LLE Sensation: Tingling (09/03/18 0015) RLE Motor Response: Non-purposeful movement (09/03/18 0015) RLE Sensation: Tingling (09/03/18 0015)     Epidural/Spinal Function Cutaneous sensation: Able to Discern Pressure (09/03/18 0015), Patient able to flex knees: Yes (09/03/18 0015), Patient able to lift hips off bed: No (09/03/18 0015), Back pain beyond tenderness at insertion site: No (09/03/18 0015), Progressively worsening motor and/or sensory loss: No (09/03/18 0015), Bowel and/or bladder incontinence post epidural: No (09/03/18 0015)  Verdis Bassette DANIEL

## 2018-09-04 LAB — CBC
HCT: 28.7 % — ABNORMAL LOW (ref 36.0–46.0)
Hemoglobin: 10.2 g/dL — ABNORMAL LOW (ref 12.0–15.0)
MCH: 32.5 pg (ref 26.0–34.0)
MCHC: 35.5 g/dL (ref 30.0–36.0)
MCV: 91.4 fL (ref 80.0–100.0)
Platelets: 167 10*3/uL (ref 150–400)
RBC: 3.14 MIL/uL — ABNORMAL LOW (ref 3.87–5.11)
RDW: 13.2 % (ref 11.5–15.5)
WBC: 13.9 10*3/uL — ABNORMAL HIGH (ref 4.0–10.5)
nRBC: 0 % (ref 0.0–0.2)

## 2018-09-04 LAB — COMPREHENSIVE METABOLIC PANEL
ALT: 18 U/L (ref 0–44)
AST: 32 U/L (ref 15–41)
Albumin: 1.8 g/dL — ABNORMAL LOW (ref 3.5–5.0)
Alkaline Phosphatase: 74 U/L (ref 38–126)
Anion gap: 6 (ref 5–15)
BUN: 13 mg/dL (ref 6–20)
CO2: 22 mmol/L (ref 22–32)
Calcium: 6.5 mg/dL — ABNORMAL LOW (ref 8.9–10.3)
Chloride: 104 mmol/L (ref 98–111)
Creatinine, Ser: 0.71 mg/dL (ref 0.44–1.00)
GFR calc Af Amer: >60 mL/min (ref >60)
GFR calc non Af Amer: >60 mL/min (ref >60)
Glucose, Bld: 81 mg/dL (ref 70–99)
Potassium: 4.2 mmol/L (ref 3.5–5.1)
Sodium: 132 mmol/L — ABNORMAL LOW (ref 135–145)
Total Bilirubin: 0.4 mg/dL (ref 0.3–1.2)
Total Protein: 3.9 g/dL — ABNORMAL LOW (ref 6.5–8.1)

## 2018-09-04 MED ORDER — NIFEDIPINE ER OSMOTIC RELEASE 30 MG PO TB24
30.0000 mg | ORAL_TABLET | Freq: Every day | ORAL | Status: AC
  Administered 2018-09-05: 30 mg via ORAL
  Filled 2018-09-04: qty 1

## 2018-09-04 NOTE — Lactation Note (Signed)
This note was copied from a baby's chart. Lactation Consultation Note  Patient Name: Norma Newman GSUPJ'S Date: 09/04/2018 Reason for consult: Follow-up assessment;Early term 37-38.6wks;Primapara;1st time breastfeeding;Infant < 6lbs  P1 mother whose infant is now 63 hours old.  This is an ETI at 37+2 days weighing < 6 lbs.  Mother has been on magnesium sulfate but it has been discontinued as of yesterday.  Mother stated that she "feels much better" today.  Baby has received only formula since 1600 yesterday.  Mother's feeding preference is breast/bottle.  Baby was asleep in mother's arms when I arrived.  Mother has not been putting baby to breast consistently or pumping consistently.  Discussed with her the importance of latching baby to breast with every feed today.  Encouraged to feed 8-12 times/24 hours rf sooner if baby shows cues.  Offered to assist with latching if mother needs assistance.  Encouraged her to have RN/LC observe latching and feeding.  Discussed why it is important to feed/pump regularly to help establish a full milk supply.  Mother verbalized understanding.  Feeding plan verbalized.  Also discussed increasing feeding supplementation volumes if baby is not interested in breast feeding.    Mother has a DEBP for home use.  She is hoping to be discharged tomorrow.     Maternal Data Formula Feeding for Exclusion: Yes Reason for exclusion: Mother's choice to formula and breast feed on admission Has patient been taught Hand Expression?: Yes Does the patient have breastfeeding experience prior to this delivery?: No  Feeding Feeding Type: Bottle Fed - Formula Nipple Type: Slow - flow  LATCH Score                   Interventions    Lactation Tools Discussed/Used WIC Program: No Pump Review: (Mother needed no review with pump)   Consult Status Consult Status: Follow-up Date: 09/05/18 Follow-up type: In-patient    Norma Newman 09/04/2018, 8:31  AM

## 2018-09-04 NOTE — Progress Notes (Signed)
Postop Note Day # 2  S:  Patient resting comfortable in bed.  Pain controlled. Tolerating general diet. Not sure about flatus, no BM.  Lochia minimal. Pt has ambulated and voided on her own  She denies n/v/f/c, SOB, or CP  O: Temp:  [97.7 F (36.5 C)-98.3 F (36.8 C)] 98.3 F (36.8 C) (06/25 0747) Pulse Rate:  [70-100] 70 (06/25 0747) Resp:  [16-20] 18 (06/25 0747) BP: (121-146)/(54-84) 138/83 (06/25 0747) SpO2:  [94 %-99 %] 95 % (06/25 0747)  UOP: 2875/8hr  Gen: A&Ox3, NAD CV: RRR, no MRG Resp: CTAB Abdomen: soft, NT, ND, BS quiet Uterus: firm, non-tender, below umbilicus Incision: c/d/i, bandage on Ext: 2+ pitting edema, no calf tenderness bilaterally  Results for orders placed or performed during the hospital encounter of 09/01/18 (from the past 24 hour(s))  Magnesium     Status: Abnormal   Collection Time: 09/03/18  4:53 PM  Result Value Ref Range   Magnesium 6.6 (HH) 1.7 - 2.4 mg/dL  CBC     Status: Abnormal   Collection Time: 09/04/18  6:30 AM  Result Value Ref Range   WBC 13.9 (H) 4.0 - 10.5 K/uL   RBC 3.14 (L) 3.87 - 5.11 MIL/uL   Hemoglobin 10.2 (L) 12.0 - 15.0 g/dL   HCT 28.7 (L) 36.0 - 46.0 %   MCV 91.4 80.0 - 100.0 fL   MCH 32.5 26.0 - 34.0 pg   MCHC 35.5 30.0 - 36.0 g/dL   RDW 13.2 11.5 - 15.5 %   Platelets 167 150 - 400 K/uL   nRBC 0.0 0.0 - 0.2 %  Comprehensive metabolic panel     Status: Abnormal   Collection Time: 09/04/18  6:30 AM  Result Value Ref Range   Sodium 132 (L) 135 - 145 mmol/L   Potassium 4.2 3.5 - 5.1 mmol/L   Chloride 104 98 - 111 mmol/L   CO2 22 22 - 32 mmol/L   Glucose, Bld 81 70 - 99 mg/dL   BUN 13 6 - 20 mg/dL   Creatinine, Ser 0.71 0.44 - 1.00 mg/dL   Calcium 6.5 (L) 8.9 - 10.3 mg/dL   Total Protein 3.9 (L) 6.5 - 8.1 g/dL   Albumin 1.8 (L) 3.5 - 5.0 g/dL   AST 32 15 - 41 U/L   ALT 18 0 - 44 U/L   Alkaline Phosphatase 74 38 - 126 U/L   Total Bilirubin 0.4 0.3 - 1.2 mg/dL   GFR calc non Af Amer >60 >60 mL/min   GFR calc Af  Amer >60 >60 mL/min   Anion gap 6 5 - 15    A/P: Pt is a 38 y.o. G1P1 s/p primary C-section, POD#2  -Preeclampsia  S/p Mag x 24hr, BP remains stable  Pt asymptomatic  Edema has started to improve with diuresis - Pain controlled -GU: voiding freely -GI: Tolerating general diet -Activity: encouraged sitting up to chair and ambulation as tolerated -Prophylaxis: SCDs while in bed, encourage ambulation -Labs: stable as above  Continue with postop care as outlined above  Janyth Pupa, DO [phone] (cell) [phone] (office)

## 2018-09-05 MED ORDER — IBUPROFEN 600 MG PO TABS: 600.0000 mg | ORAL_TABLET | Freq: Four times a day (QID) | ORAL | 0 refills | Status: AC

## 2018-09-05 MED ORDER — OXYCODONE-ACETAMINOPHEN 5-325 MG PO TABS: 1.0000 | ORAL_TABLET | Freq: Four times a day (QID) | ORAL | 0 refills | Status: AC | PRN

## 2018-09-05 MED ORDER — NIFEDIPINE ER 30 MG PO TB24: 30.0000 mg | ORAL_TABLET | Freq: Every day | ORAL | 1 refills | Status: AC

## 2018-09-05 MED ORDER — DOCUSATE SODIUM 100 MG PO CAPS: 100.0000 mg | ORAL_CAPSULE | Freq: Two times a day (BID) | ORAL | 0 refills | Status: AC

## 2018-09-05 MED ORDER — NIFEDIPINE ER OSMOTIC RELEASE 30 MG PO TB24
30.0000 mg | ORAL_TABLET | Freq: Every day | ORAL | Status: DC
  Administered 2018-09-05: 30 mg via ORAL
  Filled 2018-09-05: qty 1

## 2018-09-05 NOTE — Lactation Note (Signed)
This note was copied from a baby's chart. Lactation Consultation Note  Patient Name: Girl Geraldean Walen IPPGF'Q Date: 09/05/2018 Reason for consult: Follow-up assessment;Early term 37-38.6wks;1st time breastfeeding;Primapara;Infant < 6lbs  P1 mother whose infant is now 75 hours old.  This is an ETI at 37+2 weeks weighing < 6 lbs.  Mother's feeding preference is breast/bottle.  Baby was asleep in the bassinet when I arrived.  Mother has been only formula feeding since my visit yesterday, although, mother stated that she has been putting baby to breast at times.  I offered to assist/observe latching today before discharge but mother politely refused.  Yesterday I discussed with her the importance of latching and post pumping after breast feeding to help increase mother's milk supply.  Mother verbalized understanding.  However, I have not observed a breast feeding and mother also informed me that she pumped once yesterday.  Again, I reviewed the importance of latching and pumping consistently to establish supply and mother verbalized understanding.  Engorgement prevention/treatment reviewed.  Mother has a manual pump and a DEBP for home use.  She has our OP phone number for questions/concerns after discharge.  Father present.  Encouraged mother to call me back for assistance as needed prior to discharge.   Maternal Data Formula Feeding for Exclusion: Yes Reason for exclusion: Mother's choice to formula and breast feed on admission Has patient been taught Hand Expression?: Yes Does the patient have breastfeeding experience prior to this delivery?: No  Feeding Feeding Type: Bottle Fed - Formula  LATCH Score                   Interventions    Lactation Tools Discussed/Used     Consult Status Consult Status: Complete Date: 09/05/18 Follow-up type: Call as needed    Abriella Filkins R Ellena Kamen 09/05/2018, 8:40 AM

## 2018-09-05 NOTE — Discharge Instructions (Signed)
Cesarean Delivery, Care After This sheet gives you information about how to care for yourself after your procedure. Your health care provider may also give you more specific instructions. If you have problems or questions, contact your health care provider. What can I expect after the procedure? After the procedure, it is common to have:  A small amount of blood or clear fluid coming from the incision.  Some redness, swelling, and pain in your incision area.  Some abdominal pain and soreness.  Vaginal bleeding (lochia). Even though you did not have a vaginal delivery, you will still have vaginal bleeding and discharge.  Pelvic cramps.  Fatigue. You may have pain, swelling, and discomfort in the tissue between your vagina and your anus (perineum) if:  Your C-section was unplanned, and you were allowed to labor and push.  An incision was made in the area (episiotomy) or the tissue tore during attempted vaginal delivery. Follow these instructions at home: Incision care   Follow instructions from your health care provider about how to take care of your incision. Make sure you: ? You may remove the honeycomb dressing on Monday in the shower.  The steris (band-aids) underneath may stay on until your visit. ? If you have a dressing, change it or remove it as told by your health care provider. ? Leave stitches (sutures), skin staples, skin glue, or adhesive strips in place. These skin closures may need to stay in place for 2 weeks or longer. If adhesive strip edges start to loosen and curl up, you may trim the loose edges. Do not remove adhesive strips completely unless your health care provider tells you to do that.  Check your incision area every day for signs of infection. Check for: ? More redness, swelling, or pain. ? More fluid or blood. ? Warmth. ? Pus or a bad smell.  Do not take baths, swim, or use a hot tub until your health care provider says it's okay. Ask your health care  provider if you can take showers.  When you cough or sneeze, hug a pillow. This helps with pain and decreases the chance of your incision opening up (dehiscing). Do this until your incision heals. Medicines  Take over-the-counter and prescription medicines only as told by your health care provider.  For pain management alternate between ibuprofen (motrin) and percocet.  If the pain is only mild take tylenol instead of the percocet.  While taking pain medication be sure to continue with stool softener 1-2x daily  If you were prescribed an antibiotic medicine, take it as told by your health care provider. Do not stop taking the antibiotic even if you start to feel better.  Do not drive or use heavy machinery while taking prescription pain medicine. Lifestyle  Do not drink alcohol. This is especially important if you are breastfeeding or taking pain medicine.  Do not use any products that contain nicotine or tobacco, such as cigarettes, e-cigarettes, and chewing tobacco. If you need help quitting, ask your health care provider. Eating and drinking  Drink at least 8 eight-ounce glasses of water every day unless told not to by your health care provider. If you breastfeed, you may need to drink even more water.  Eat high-fiber foods every day. These foods may help prevent or relieve constipation. High-fiber foods include: ? Whole grain cereals and breads. ? Brown rice. ? Beans. ? Fresh fruits and vegetables. Activity   If possible, have someone help you care for your baby and help with  household activities for at least a few days after you leave the hospital.  Return to your normal activities as told by your health care provider. Ask your health care provider what activities are safe for you.  Rest as much as possible. Try to rest or take a nap while your baby is sleeping.  Do not lift anything that is heavier than 10 lbs (4.5 kg), or the limit that you were told, until your health care  provider says that it is safe.  Talk with your health care provider about when you can engage in sexual activity. This may depend on your: ? Risk of infection. ? How fast you heal. ? Comfort and desire to engage in sexual activity. General instructions  Do not use tampons or douches until your health care provider approves.  Wear loose, comfortable clothing and a supportive and well-fitting bra.  Keep your perineum clean and dry. Wipe from front to back when you use the toilet.  If you pass a blood clot, save it and call your health care provider to discuss. Do not flush blood clots down the toilet before you get instructions from your health care provider.  Keep all follow-up visits for you and your baby as told by your health care provider. This is important. Contact a health care provider if:  You have: ? A fever. ? Bad-smelling vaginal discharge. ? Pus or a bad smell coming from your incision. ? Difficulty or pain when urinating. ? A sudden increase or decrease in the frequency of your bowel movements. ? More redness, swelling, or pain around your incision. ? More fluid or blood coming from your incision. ? A rash. ? Nausea. ? Little or no interest in activities you used to enjoy. ? Questions about caring for yourself or your baby.  Your incision feels warm to the touch.  Your breasts turn red or become painful or hard.  You feel unusually sad or worried.  You vomit.  You pass a blood clot from your vagina.  You urinate more than usual.  You are dizzy or light-headed. Get help right away if:  You have: ? Pain that does not go away or get better with medicine. ? Chest pain. ? Difficulty breathing. ? Blurred vision or spots in your vision. ? Thoughts about hurting yourself or your baby. ? New pain in your abdomen or in one of your legs. ? A severe headache.  You faint.  You bleed from your vagina so much that you fill more than one sanitary pad in one hour.  Bleeding should not be heavier than your heaviest period. Summary  After the procedure, it is common to have pain at your incision site, abdominal cramping, and slight bleeding from your vagina.  Check your incision area every day for signs of infection.  Tell your health care provider about any unusual symptoms.  Keep all follow-up visits for you and your baby as told by your health care provider. This information is not intended to replace advice given to you by your health care provider. Make sure you discuss any questions you have with your health care provider. Document Released: 11/18/2001 Document Revised: 09/04/2017 Document Reviewed: 09/04/2017 Elsevier Interactive Patient Education  2019 [employer].

## 2018-09-05 NOTE — Progress Notes (Signed)
Pt given printed discharge instructions. Patient verbalized an understanding. No concerns noted. Toya Smothers, RN

## 2018-09-05 NOTE — Discharge Summary (Signed)
OB Discharge Summary     Patient Name: Norma Newman DOB: Aug 16, 1980 MRN: [id number]  Date of admission: 09/01/2018 Delivering MD: Sanjuana Kava   Date of discharge: 09/05/2018  Admitting diagnosis:  Preeclampsia without severe features Intrauterine pregnancy @ [redacted]w[redacted]d Hypothyroidism Uterine fibroids IVF pregnancy      Discharge diagnosis: Term Pregnancy Delivered, Preeclampsia (mild) and Hypothyroidism                                                                                                Post partum procedures:n/a  Augmentation: AROM, Pitocin, Cytotec and Foley Balloon  Complications: None  Hospital course:  Induction of Labor With Cesarean Section  38 y.o. yo G1P0000 at [redacted]w[redacted]d was admitted to the hospital 09/01/2018 for induction of labor. Patient had a labor course significant for arrest of descent. The patient went for cesarean section due to Arrest of Descent, and delivered a Viable infant,09/02/2018  Membrane Rupture Time/Date: 6:00 AM ,09/02/2018   Details of operation can be found in separate operative Note.  Patient had an uncomplicated postpartum course. She is ambulating, tolerating a regular diet, passing flatus, and urinating well.  Patient is discharged home in stable condition on 09/05/18.                                    Physical exam  Vitals:   09/04/18 1626 09/04/18 1916 09/04/18 2338 09/05/18 0317  BP: (!) 153/94 (!) 143/97 (!) 153/95 (!) 147/86  Pulse: 74 70 78 69  Resp: 18 17 17 18   Temp: 99.3 F (37.4 C) 97.9 F (36.6 C) 98.3 F (36.8 C) 98.1 F (36.7 C)  TempSrc: Oral Oral Oral   SpO2: 98% 100% 97% 99%  Weight:      Height:       General: alert, cooperative and no distress Lochia: appropriate CV: RRR Lungs: CTAB Uterine Fundus: firm Incision: Dressing is clean, dry, and intact DVT Evaluation: 2+ edema noted, No evidence of DVT seen on physical exam. Labs: Lab Results  Component Value Date   WBC 13.9 (H) 09/04/2018   HGB 10.2 (L)  09/04/2018   HCT 28.7 (L) 09/04/2018   MCV 91.4 09/04/2018   PLT 167 09/04/2018   CMP Latest Ref Rng & Units 09/04/2018  Glucose 70 - 99 mg/dL 81  BUN 6 - 20 mg/dL 13  Creatinine 0.44 - 1.00 mg/dL 0.71  Sodium 135 - 145 mmol/L 132(L)  Potassium 3.5 - 5.1 mmol/L 4.2  Chloride 98 - 111 mmol/L 104  CO2 22 - 32 mmol/L 22  Calcium 8.9 - 10.3 mg/dL 6.5(L)  Total Protein 6.5 - 8.1 g/dL 3.9(L)  Total Bilirubin 0.3 - 1.2 mg/dL 0.4  Alkaline Phos 38 - 126 U/L 74  AST 15 - 41 U/L 32  ALT 0 - 44 U/L 18    Discharge instruction: per After Visit Summary and "Baby and Me Booklet".  After visit meds:  Allergies as of 09/05/2018      Reactions   Bactrim [sulfamethoxazole-trimethoprim] Hives   Stomach pain,  hives, itching      Medication List    TAKE these medications   docusate sodium 100 MG capsule Commonly known as: Colace Take 1 capsule (100 mg total) by mouth 2 (two) times daily.   ibuprofen 600 MG tablet Commonly known as: ADVIL Take 1 tablet (600 mg total) by mouth every 6 (six) hours.   levothyroxine 100 MCG tablet Commonly known as: SYNTHROID Take 100 mcg by mouth daily before breakfast. 0.5 mg daily   multivitamin-prenatal 27-0.8 MG Tabs tablet Take 1 tablet by mouth daily at 12 noon.   NIFEdipine 30 MG 24 hr tablet Commonly known as: ADALAT CC Take 1 tablet (30 mg total) by mouth daily.   oxyCODONE-acetaminophen 5-325 MG tablet Commonly known as: Percocet Take 1 tablet by mouth every 6 (six) hours as needed for up to 7 days for moderate pain or severe pain.       Diet: routine diet  Activity: Advance as tolerated. Pelvic rest for 6 weeks.   Outpatient follow up:1 week- RN visit Follow up Appt:No future appointments. Follow up Visit:No follow-ups on file.  Postpartum contraception: Not Discussed  Newborn Data: Live born female  Birth Weight: 5 lb 13.5 oz (2650 g) APGAR: 8, 9  Newborn Delivery   Birth date/time: 09/02/2018 22:48:00 Delivery type:  C-Section, Low Vertical Trial of labor: Yes C-section categorization: Primary      Baby Feeding: Breast Disposition:home with mother   09/05/2018 Annalee Genta, DO

## 2018-12-14 ENCOUNTER — Encounter (HOSPITAL_COMMUNITY): Payer: Self-pay

## 2019-03-10 ENCOUNTER — Other Ambulatory Visit: Payer: Self-pay | Admitting: Family Medicine

## 2019-03-10 DIAGNOSIS — M542 Cervicalgia: Secondary | ICD-10-CM

## 2019-03-20 ENCOUNTER — Other Ambulatory Visit: Payer: Self-pay

## 2019-03-20 ENCOUNTER — Ambulatory Visit (INDEPENDENT_AMBULATORY_CARE_PROVIDER_SITE_OTHER): Payer: BC Managed Care – PPO

## 2019-03-20 DIAGNOSIS — M542 Cervicalgia: Secondary | ICD-10-CM | POA: Diagnosis not present

## 2019-03-20 MED ORDER — IOHEXOL 300 MG/ML  SOLN
100.0000 mL | Freq: Once | INTRAMUSCULAR | Status: AC | PRN
  Administered 2019-03-20: 13:00:00 75 mL via INTRAVENOUS

## 2019-04-08 ENCOUNTER — Other Ambulatory Visit: Payer: Self-pay | Admitting: Endocrinology

## 2019-04-08 DIAGNOSIS — E041 Nontoxic single thyroid nodule: Secondary | ICD-10-CM

## 2019-09-23 ENCOUNTER — Ambulatory Visit
Admission: RE | Admit: 2019-09-23 | Discharge: 2019-09-23 | Disposition: A | Discharge status: Home / Self Care | Payer: BC Managed Care – PPO | Source: Ambulatory Visit | Attending: Endocrinology | Admitting: Endocrinology

## 2019-09-23 DIAGNOSIS — E041 Nontoxic single thyroid nodule: Secondary | ICD-10-CM

## 2020-12-27 IMAGING — US US THYROID
1 series · 14 of 25 positions shown · non-contrast
Comparison: CT 03/20/2019

CLINICAL DATA: 3 mm hypodense right thyroid nodule incidentally
noted on CT neck. History of thyroiditis.

EXAM:
THYROID ULTRASOUND
TECHNIQUE: Ultrasound examination of the thyroid gland and adjacent soft
tissues was performed.

[Series 1: us thyroid · 0.06mm/px · 14 of 40 slices shown]
[im 1/40]
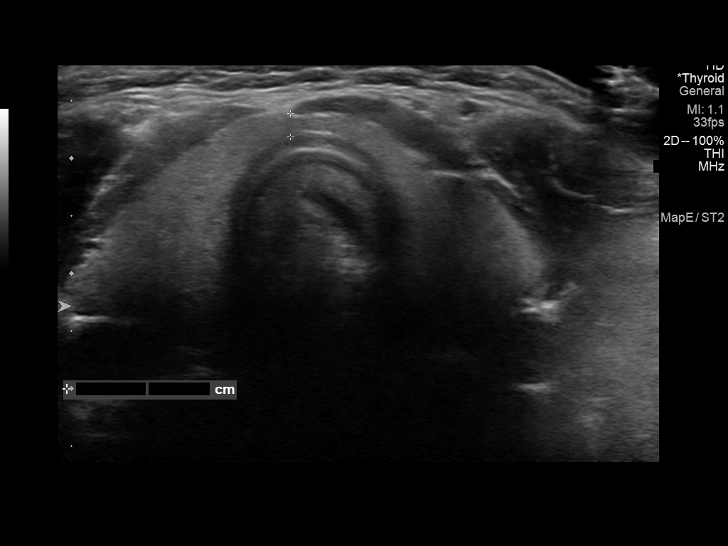
[im 4/40]
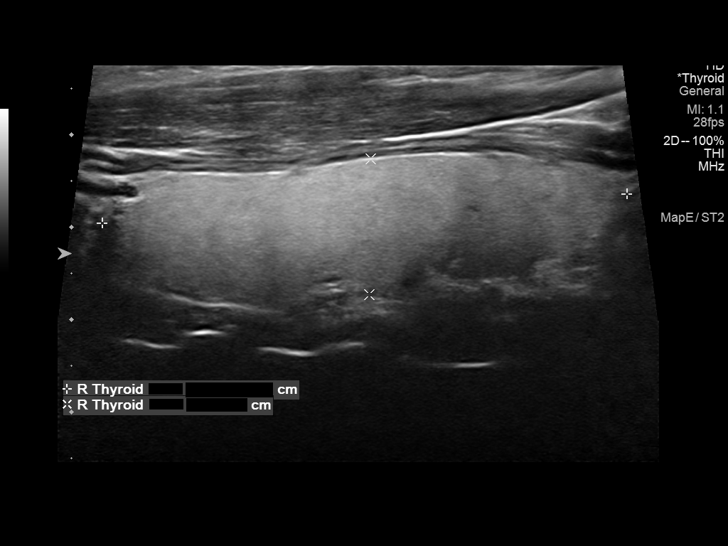
[im 7/40]
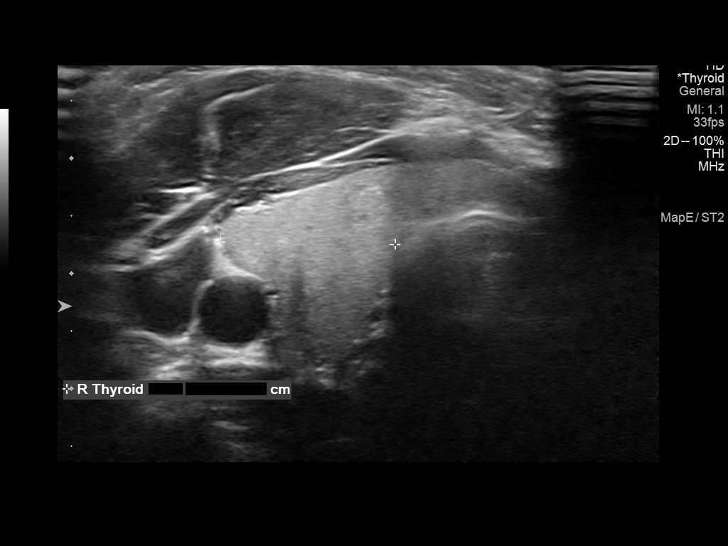
[im 10/40]
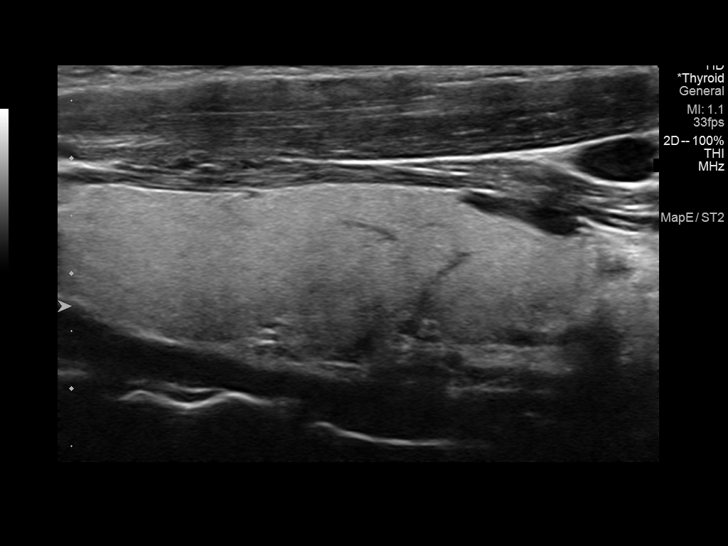
[im 14/40]
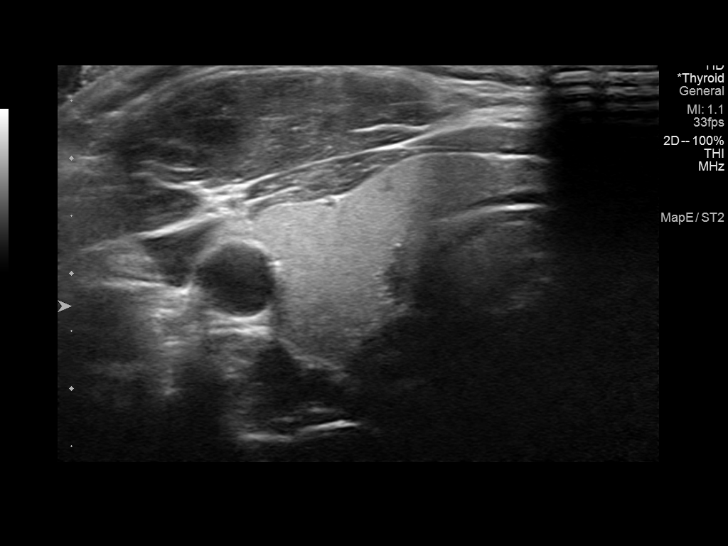
[im 15/40]
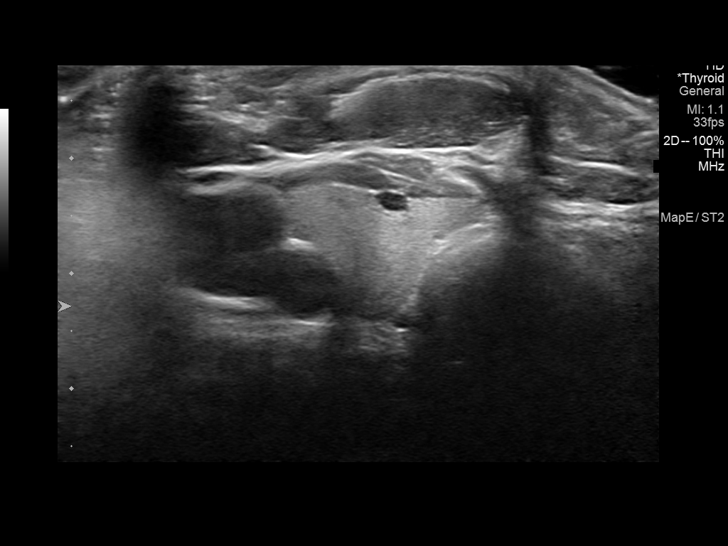
[im 18/40]
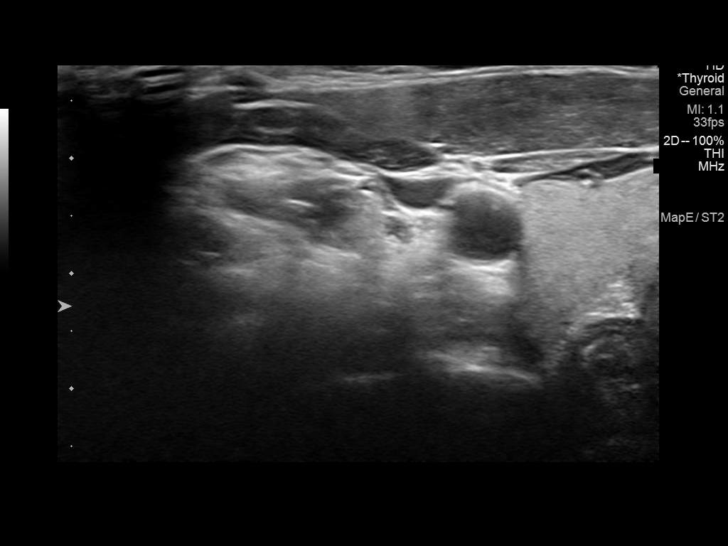
[im 22/40]
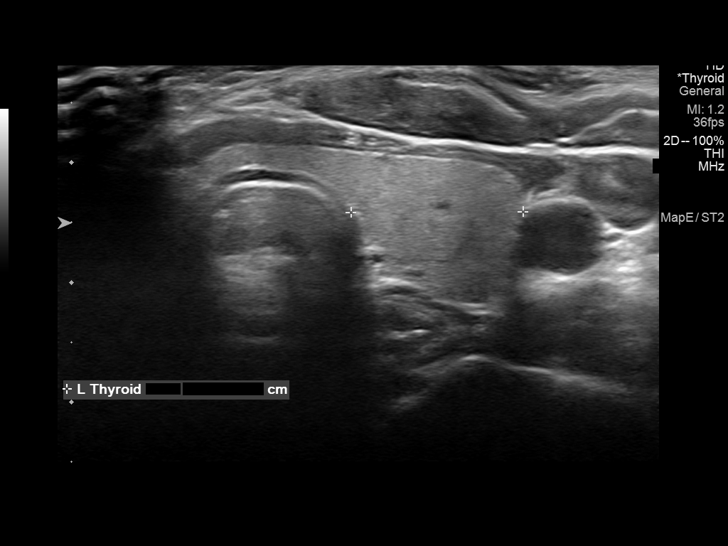
[im 25/40]
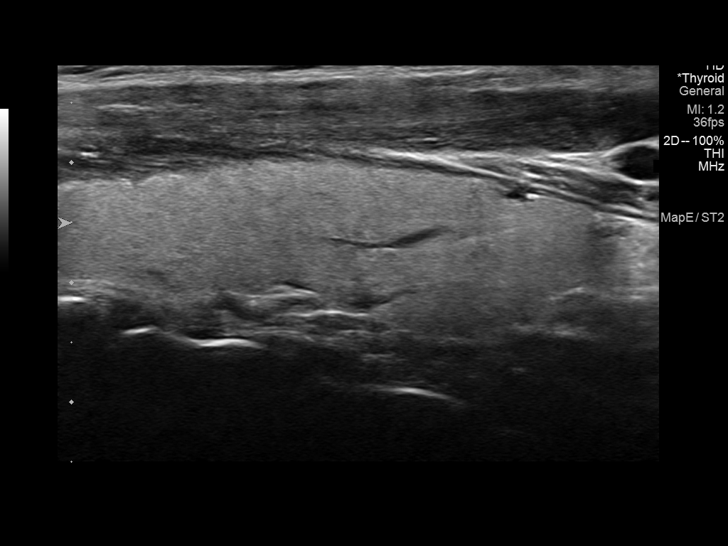
[im 27/40]
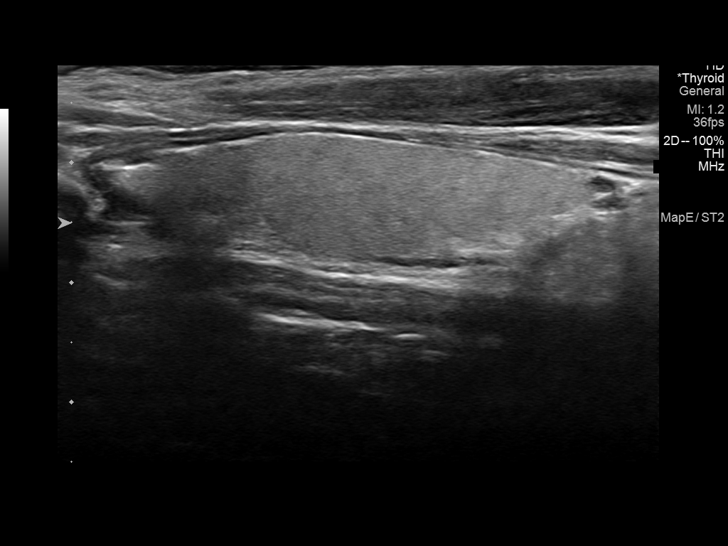
[im 30/40]
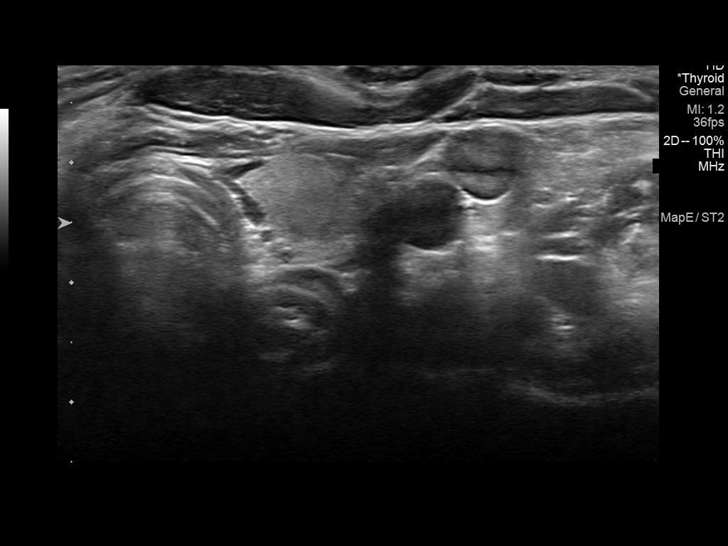
[im 33/40]
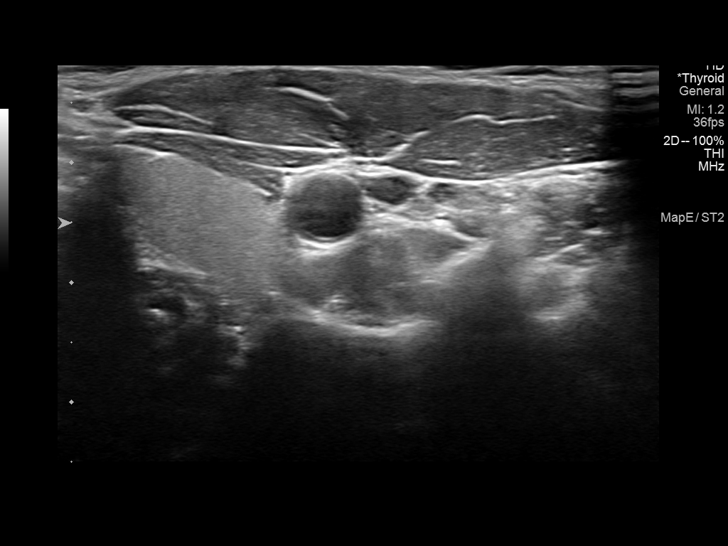
[im 36/40]
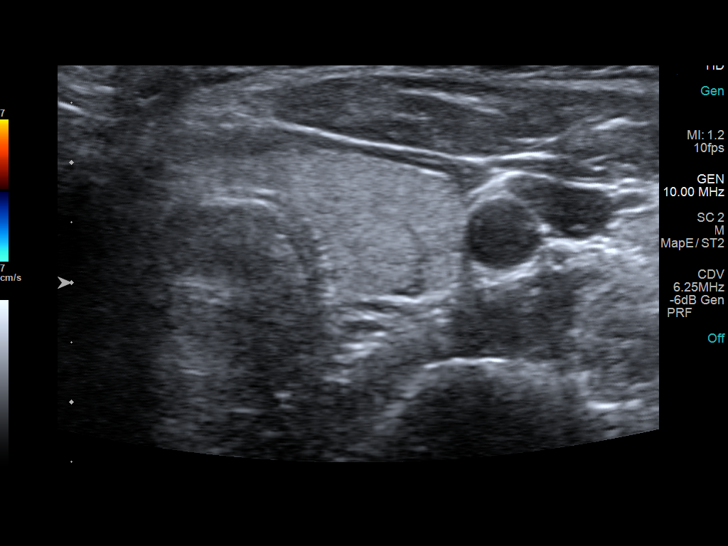
[im 40/40]
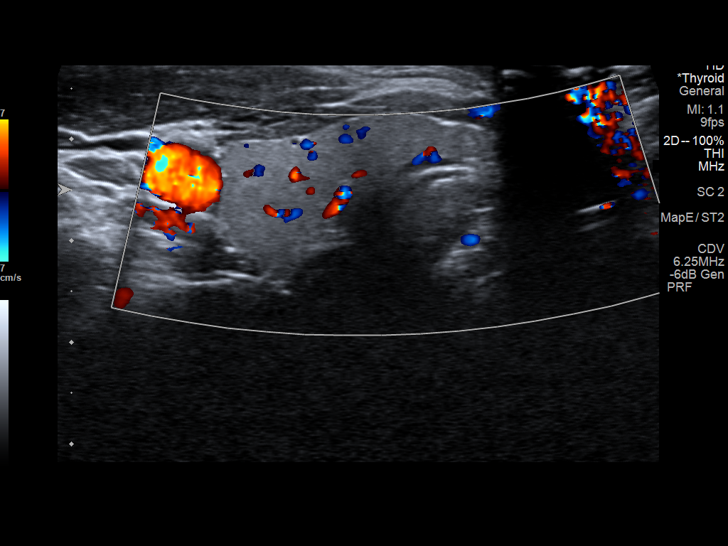

[14 of 25 positions shown; findings below may reference images not displayed]

FINDINGS: Parenchymal Echotexture: Normal

Isthmus: 0.2 cm thickness

Right lobe: 5.7 x 1.5 x 1.5 cm

Left lobe: 5.3 x 1.1 x 1.4 cm

_________________________________________________________

Estimated total number of nodules >/= 1 cm: 0

Number of spongiform nodules >/=  2 cm not described below (TR1): 0

Number of mixed cystic and solid nodules >/= 1.5 cm not described
below (TR2): 0

_________________________________________________________

No discrete nodules are seen within the thyroid gland.
IMPRESSION: Normal thyroid

The above is in keeping with the ACR TI-RADS recommendations - [HOSPITAL] 5937;[DATE].
